# Patient Record
Sex: Female | Born: 1955 | Race: White | Hispanic: No | Marital: Married | Smoking: Never smoker
Health system: Southern US, Community
[De-identification: ages and names within clinical notes are randomized; demographics above are authoritative.]

## PROBLEM LIST (undated history)

## (undated) DIAGNOSIS — D649 Anemia, unspecified: Secondary | ICD-10-CM

## (undated) DIAGNOSIS — R7303 Prediabetes: Secondary | ICD-10-CM

## (undated) DIAGNOSIS — F329 Major depressive disorder, single episode, unspecified: Secondary | ICD-10-CM

## (undated) DIAGNOSIS — E785 Hyperlipidemia, unspecified: Secondary | ICD-10-CM

## (undated) DIAGNOSIS — F419 Anxiety disorder, unspecified: Secondary | ICD-10-CM

## (undated) DIAGNOSIS — R911 Solitary pulmonary nodule: Secondary | ICD-10-CM

## (undated) DIAGNOSIS — I10 Essential (primary) hypertension: Secondary | ICD-10-CM

## (undated) DIAGNOSIS — M419 Scoliosis, unspecified: Secondary | ICD-10-CM

## (undated) DIAGNOSIS — E119 Type 2 diabetes mellitus without complications: Secondary | ICD-10-CM

## (undated) DIAGNOSIS — I712 Thoracic aortic aneurysm, without rupture, unspecified: Secondary | ICD-10-CM

## (undated) DIAGNOSIS — F32A Depression, unspecified: Secondary | ICD-10-CM

## (undated) DIAGNOSIS — M1990 Unspecified osteoarthritis, unspecified site: Secondary | ICD-10-CM

## (undated) DIAGNOSIS — R519 Headache, unspecified: Secondary | ICD-10-CM

## (undated) HISTORY — PX: CHOLECYSTECTOMY, LAPAROSCOPIC: SHX56

## (undated) HISTORY — DX: Essential (primary) hypertension: I10

## (undated) HISTORY — DX: Hyperlipidemia, unspecified: E78.5

## (undated) HISTORY — DX: Type 2 diabetes mellitus without complications: E11.9

## (undated) HISTORY — PX: ANKLE SURGERY: SHX546

## (undated) HISTORY — PX: HEMORRHOID SURGERY: SHX153

## (undated) HISTORY — PX: BREAST BIOPSY: SHX20

## (undated) HISTORY — PX: ESOPHAGOGASTRODUODENOSCOPY: SHX1529

## (undated) HISTORY — DX: Major depressive disorder, single episode, unspecified: F32.9

## (undated) HISTORY — DX: Solitary pulmonary nodule: R91.1

## (undated) HISTORY — DX: Thoracic aortic aneurysm, without rupture, unspecified: I71.20

## (undated) HISTORY — DX: Depression, unspecified: F32.A

## (undated) HISTORY — DX: Scoliosis, unspecified: M41.9

---

## 2006-02-27 ENCOUNTER — Other Ambulatory Visit: Admission: RE | Admit: 2006-02-27 | Discharge: 2006-02-27 | Payer: Self-pay | Admitting: Obstetrics & Gynecology

## 2006-03-19 ENCOUNTER — Encounter: Admission: RE | Admit: 2006-03-19 | Discharge: 2006-03-19 | Payer: Self-pay | Admitting: Obstetrics & Gynecology

## 2007-01-07 ENCOUNTER — Ambulatory Visit (HOSPITAL_COMMUNITY): Admission: RE | Admit: 2007-01-07 | Discharge: 2007-01-07 | Payer: Self-pay | Admitting: Gastroenterology

## 2007-04-10 ENCOUNTER — Ambulatory Visit: Payer: Self-pay | Admitting: Internal Medicine

## 2007-06-09 DIAGNOSIS — E669 Obesity, unspecified: Secondary | ICD-10-CM | POA: Insufficient documentation

## 2007-06-09 DIAGNOSIS — I10 Essential (primary) hypertension: Secondary | ICD-10-CM | POA: Insufficient documentation

## 2007-06-09 DIAGNOSIS — R059 Cough, unspecified: Secondary | ICD-10-CM | POA: Insufficient documentation

## 2007-06-09 DIAGNOSIS — R05 Cough: Secondary | ICD-10-CM

## 2007-06-10 ENCOUNTER — Ambulatory Visit: Payer: Self-pay | Admitting: Internal Medicine

## 2007-06-26 ENCOUNTER — Telehealth: Payer: Self-pay | Admitting: Internal Medicine

## 2007-11-16 ENCOUNTER — Other Ambulatory Visit: Admission: RE | Admit: 2007-11-16 | Discharge: 2007-11-16 | Payer: Self-pay | Admitting: Obstetrics & Gynecology

## 2009-03-10 IMAGING — CR DG CHEST 2V
2 series · 2 of 2 positions shown · non-contrast
Comparison: none

CLINICAL DATA: Cough.  
 CHEST ? 2 VIEW:

[view not recorded (1 of 2)]
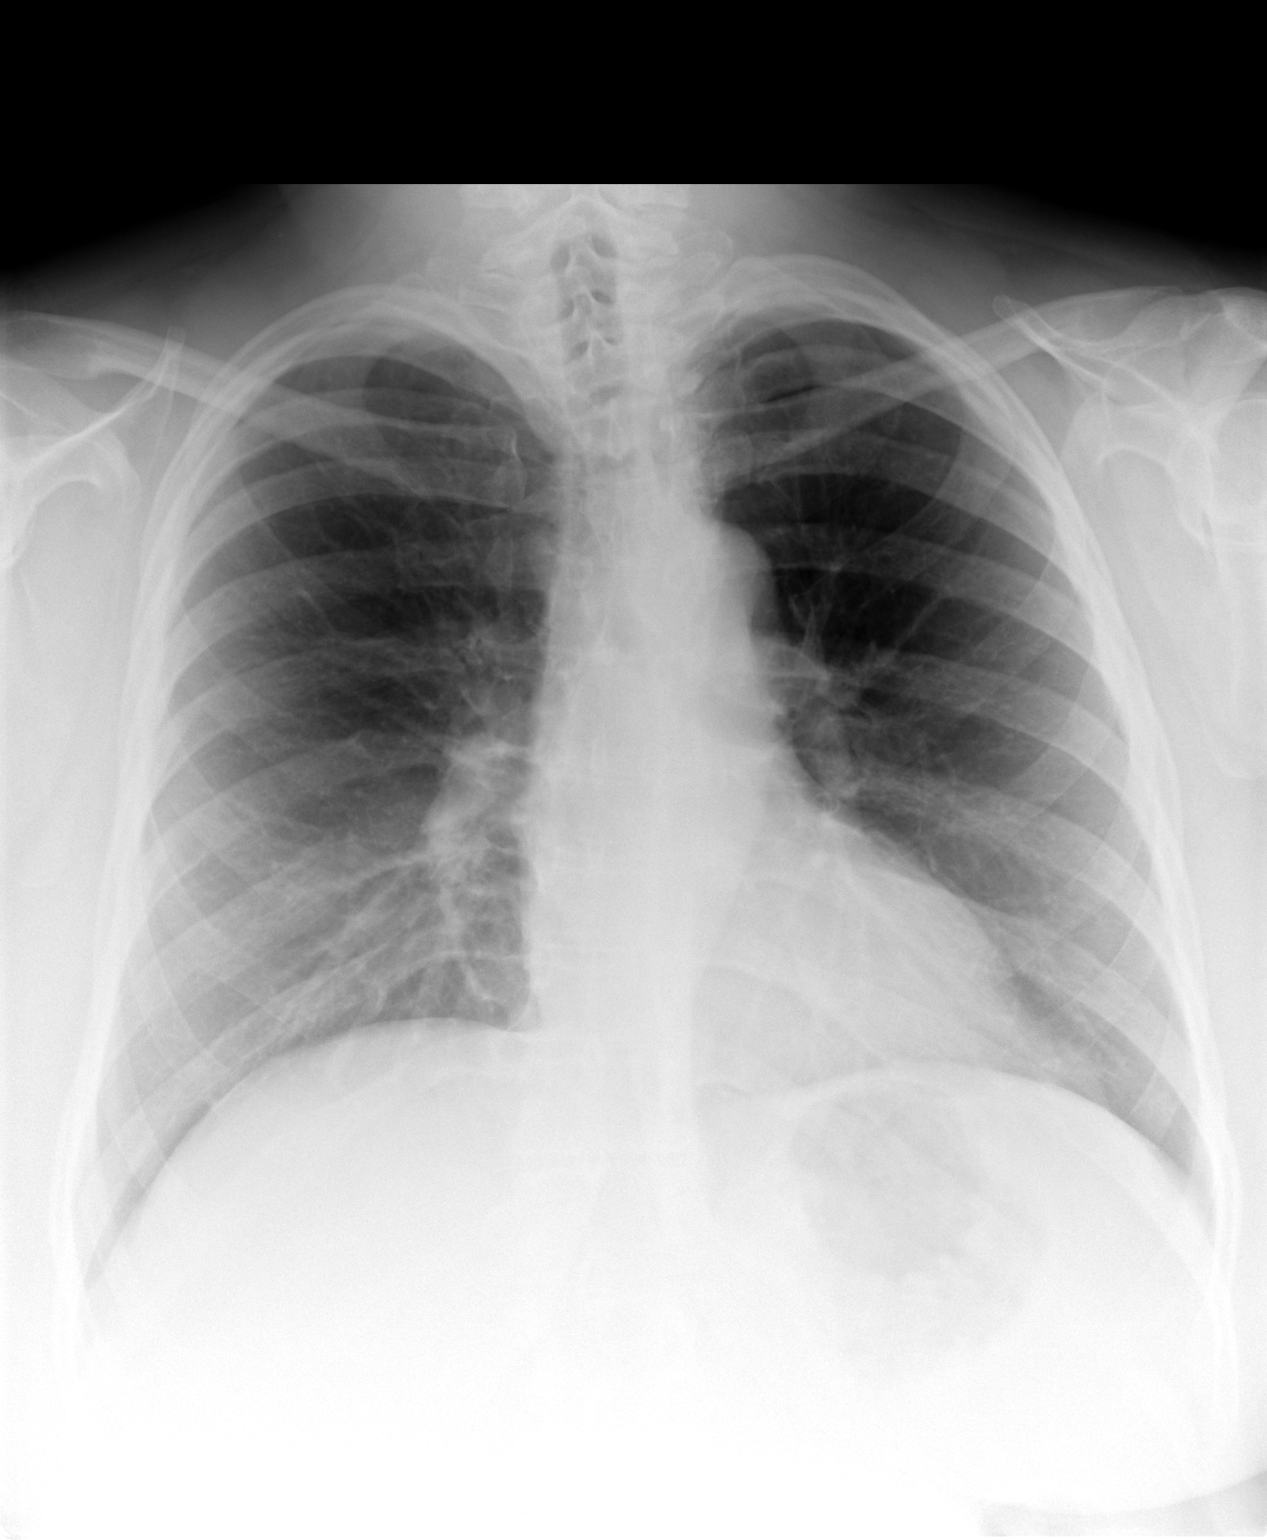

[view not recorded (2 of 2)]
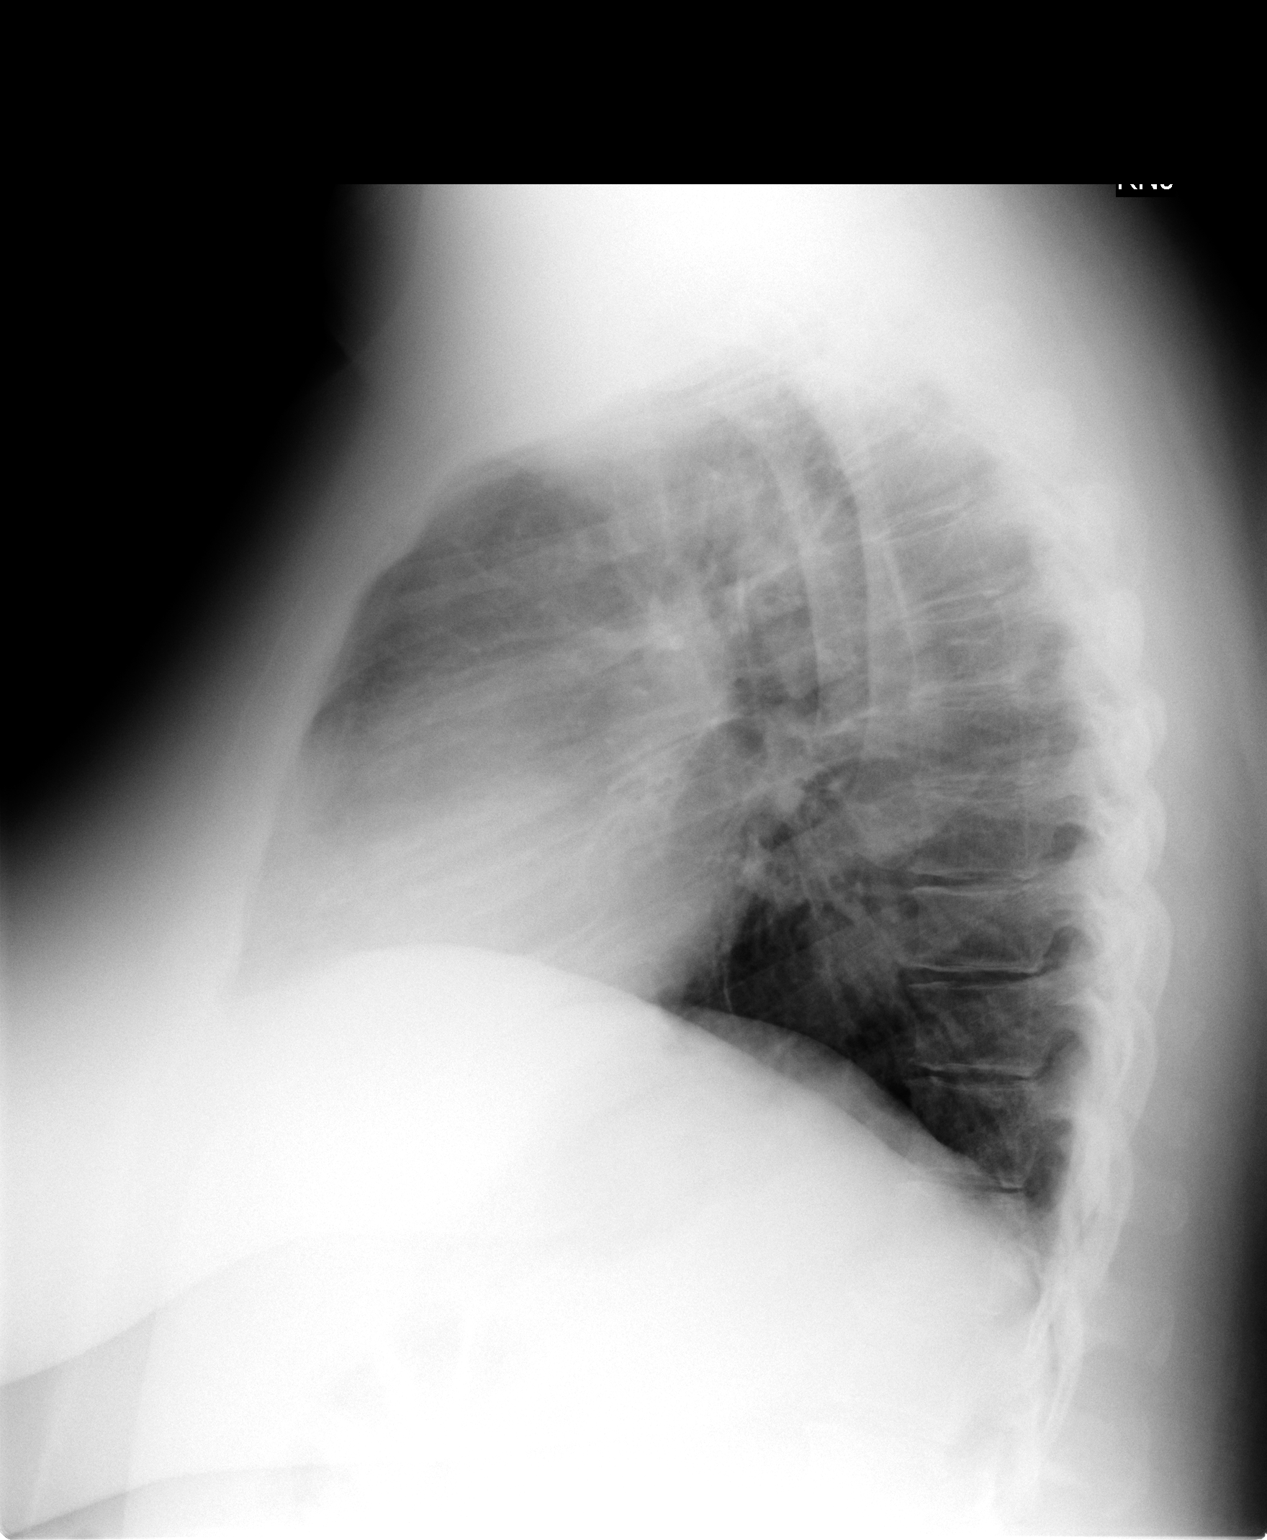

[2 of 2 positions shown; findings below may reference images not displayed]

FINDINGS: Accentuated peribronchial markings in the central lung zones compatible with chronic bronchitic changes.  No active infiltrate, consolidation, or atelectasis.  Cardiomediastinal silhouette unremarkable.
IMPRESSION: Chronic bronchitic changes.

## 2009-06-08 ENCOUNTER — Telehealth: Payer: Self-pay | Admitting: Gastroenterology

## 2009-07-18 ENCOUNTER — Ambulatory Visit: Payer: Self-pay | Admitting: Gastroenterology

## 2010-05-27 ENCOUNTER — Encounter: Payer: Self-pay | Admitting: Obstetrics & Gynecology

## 2010-06-05 NOTE — Progress Notes (Signed)
Summary: Triage  Phone Note Call from Patient Call back at [phone]   Caller: Patient Call For: Dr. Christella Hartigan Reason for Call: Talk to Nurse Summary of Call: Pt scheduled an appt. for 07-18-09 and wants to know if she can be seen sooner b/c she is "choking" Initial call taken by: Karna Christmas,  June 08, 2009 12:51 PM  Follow-up for Phone Call        Returned patient call and no answer left message on machine to call back Chales Abrahams CMA Duncan Dull)  June 08, 2009 12:59 PM   left message on machine to call back Chales Abrahams CMA [facility]ull)  June 09, 2009 10:41 AM   I called the home number as well left message on machine to call back Chales Abrahams CMA [facility] Dull)  June 09, 2009 10:42 AM

## 2010-06-05 NOTE — Assessment & Plan Note (Signed)
Summary: F/U PER PT/MH   Visit Type:  extended fu PCP:  Evelena Peat  Chief Complaint:  Pt here for rov to followup her cough.  She states that it is the same and no better or worse.  She has no new complaints..  History of Present Illness:   55 year old white female never smoker now coughing for over a year and was seen on 04/10/2007 while on ACE inhibitors with a classic upper airway cyclical cough for which I thought ACE inhibitors probably were partially finding the fire and should be stopped for at least 6 weeks.  She was treated briefly with PPI and prednisone but states nothing helps by trauma fall, which makes her too dopey to take.  The cough is especially bad immediately outlined down at night.  Since her last visit she was evaluated by an allergist he said this problem was an allergy.  In addition, she was clinically diagnosed as a  sinus infection treated with an antibiotic for a follow-up CT scan showed no evidence of any any significant sinusitis and her symptoms have persisted.  The cough is not really productive.Pt denies any significant sore throat, nasal congestion or excess secretions, fever, chills, sweats, unintended wt loss, pleuritic or exertional cp, orthopnea pnd or leg swelling.   Pt also denies any obvious fluctuation in symptoms with weather or environmental change or other alleviating or aggravating factors.     Current Meds:  SIMVASTATIN 40 MG  TABS (SIMVASTATIN) once daily AZOR 10-40 MG  TABS (AMLODIPINE-OLMESARTAN) once daily TRAMADOL HCL 50 MG  TABS (TRAMADOL HCL) as needed  Multivitamin day at breakfast    Current Allergies: No known allergies   Past Medical History:    Current Problems:     COUGH (ICD-786.2)    OBESITY (ICD-278.00)    HYPERTENSION, SEVERE (ICD-401.0)    hyperlipidemia       Past Surgical History:    status post cholecystectomy 2006   Family History:    positive for asthma in her mother        Possible heart disease in her  father  Social History:    she has never smoked.  She works as a Manufacturing systems engineer     Vital Signs:  Patient Profile:   55 Years Old Female Weight:      249.13 pounds O2 Sat:      100 % O2 treatment:    Room Air Temp:     98.0 degrees F oral Pulse rate:   66 / minute BP sitting:   126 / 78  (left arm)  Vitals Entered By: Vernie Murders (June 10, 2007 9:47 AM)                 Physical Exam  this patient appeared both anxious and depressed but sometimes a hopeless helpless affect attitude and frequent throat clearing during interview exam but no excess sputum produced.   Ambulatory healthy appearing in no acute distress. Afeb with normal vital signs HEENT: nl dentition, turbinates, and orophanx. Nl external ear canals without cough reflex Neck without JVD/Nodes/TM Lungs clear to A and P bilaterally without cough on insp or exp maneuvers RRR no s3 or murmur or increase in P2 Abd soft and benign with nl excursion in the supine position. No bruits or organomegaly Ext warm without calf tenderness, cyanosis clubbing or edema Skin warm and dry without lesions       Impression & Recommendations:  Problem # 1:  COUGH (ICD-786.2) chest x-ray  reviewed from 56 09/2006 is normal I had an extended discussion with the patient today lasting 15 to 20 minutes of a 25 minute visit on the following issues:   she has a classical upper airway cough pattern it started while she was on ACE inhibitors and probably has secondary reflux but fails to understand that coughing causes reflux which  causes secondary coughing, even though GERD is not the primary problem.  She became very frustrated when I discussed this with her MI even showing a central reflux plays and cyclical cough from a diagram I may draw on her previouchest x-ray reviewed from 6 5 2008 is normals visit.  I did go over the 14 step algorithm designed by Dr. [place] at [facility] and emphasizing that we  have not  completed step one yet, and asked to do so before we consider step two.  Since she's had a recent sinus CT scan, the next step in the workup, after GERD isoh treated aggressively and cyclical cough is limited, would be to perform a methacholine challenge test while being treated maximally for  reflux.   Each maintenance medication was reviewed in detail including most importantly the difference between maintenance and as needed and under what circumstances the prns are to be used, resulting in the following new medications recommendations: SIMVASTATIN 40 MG  TABS (SIMVASTATIN) once daily AZOR 10-40 MG  TABS (AMLODIPINE-OLMESARTAN) once daily ZEGERID 40-1100 MG  CAPS (OMEPRAZOLE-SODIUM BICARBONATE) One at bedtime BROVEX 12 MG/5ML  SUSP (BROMPHENIRAMINE TANNATE) 1 tsp twice daily DELSYM 30 MG/5ML  LQCR (DEXTROMETHORPHAN POLISTIREX) 2 tsp every 12 hours PREDNISONE 10 MG  TABS (PREDNISONE) 4 each am x 2days, 2x2days, 1x2days and stop TRAMADOL HCL 50 MG  TABS (TRAMADOL HCL) as needed    Medications Added to Medication List This Visit: 1)  Azor 10-40 Mg Tabs (Amlodipine-olmesartan) .... Once daily 2)  Zegerid 40-1100 Mg Caps (Omeprazole-sodium bicarbonate) .... One at bedtime 3)  Brovex 12 Mg/53ml Susp (Brompheniramine tannate) .Marland Kitchen.. 1 tsp twice daily 4)  Delsym 30 Mg/33ml Lqcr (Dextromethorphan polistirex) .... 2 tsp every 12 hours 5)  Prednisone 10 Mg Tabs (Prednisone) .... 4 each am x 2days, 2x2days, 1x2days and stop 6)  Tramadol Hcl 50 Mg Tabs (Tramadol hcl) .... As needed   Patient Instructions: 1)  GERD (gastroesophageal reflux disease) was discussed. It is a common cause of respiratory symptoms. It commonly presents in the absence of heartburn. GERD can be treated with medication, but also with lifestyle changes including avoidance of late meals, excessive alcohol, smoking cessation, and avoidance of foods and drinks including those that the person notes to cause symptoms, fatty  foods, chocolate, peppermint, colas, red wine, and acidic juices such as orange juice. no use of mint or menthol lozenges, use sugar free candy    Prescriptions: TRAMADOL HCL 50 MG  TABS (TRAMADOL HCL) as needed  #40 x 11   Entered and Authorized by:   Nyoka Cowden MD   Signed by:   Nyoka Cowden MD on 06/10/2007   Method used:   Electronically sent to ...       [facility]  [phone]*       [address]       [place], [place]  [address]       Ph: [phone] or [phone]       Fax: [phone]   RxID:   [id number] BROVEX 12 MG/5ML  SUSP (BROMPHENIRAMINE TANNATE) 1 tsp twice daily  #240cc  x 0   Entered and Authorized by:   Nyoka Cowden MD   Signed by:   Nyoka Cowden MD on 06/10/2007   Method used:   Electronically sent to ...       [facility]  [phone]*       [address]       [place], [place]  [address]       Ph: [phone] or [phone]       Fax: [phone]   RxID:   [id number] ZEGERID 40-1100 MG  CAPS (OMEPRAZOLE-SODIUM BICARBONATE) One at bedtime  #34 x 11   Entered and Authorized by:   Nyoka Cowden MD   Signed by:   Nyoka Cowden MD on 06/10/2007   Method used:   Electronically sent to ...       [facility]  [phone]*       [address]       [place], [place]  [address]       Ph: [phone] or [phone]       Fax: [phone]   RxID:   [id number] PREDNISONE 10 MG  TABS (PREDNISONE) 4 each am x 2days, 2x2days, 1x2days and stop  #14 x 0   Entered and Authorized by:   Nyoka Cowden MD   Signed by:   Nyoka Cowden MD on 06/10/2007   Method used:   Electronically sent to ...       [facility]  [phone]*       [address]       [place], [place]  [address]       Ph: [phone] or [phone]       Fax: [phone]   RxID:   [id number]  ]  Appended Document: Orders Update    Clinical Lists Changes  Orders: Added new Service order of Est. Patient  Level IV (28366) - Signed

## 2010-09-18 NOTE — Assessment & Plan Note (Signed)
[facility]                             PULMONARY OFFICE NOTE   Sonia, [name]                     MRN:          [id number]  DATE:04/10/2007                            DOB:          17-Mar-1956    REFERRING PHYSICIAN:  Evelena Peat, M.D.   This is a pulmonary consultation requested by Dr. Caryl Never.   REASON FOR CONSULTATION:  Cough.   HISTORY:  A 55 year old, white female, never smoker, with no significant  chronic respiratory complaints acutely ill with a bad sinus infection  in January 2008 and ever since then unable to stop coughing for any  duration.  She describes her cough as more daytime than nighttime but at  night she is noticing nasal obstructive symptoms despite using Nasonex  and has already undergone an ENT evaluation (she does not remember the  doctor's name) which was felt to be negative.  The cough is mostly dry  at this point and is associated with sensation of excess throat mucus  but actually when she coughs nothing comes up.  She denies any overt  sinus or reflux symptoms or previous history of asthma or significant  atopy or seasonable sinus complaints.  She has just undergone an  endoscopy and is felt to have no evidence of reflux, within the last  several weeks does not recall the doctor's name.   PAST MEDICAL HISTORY:  1. Hypertension for which she has been on and off ACE inhibitors over      the duration of the cough.  She says stopping them made no      difference but did not remember how long they had been      discontinued.  2. Obesity.  3. Status post cholecystectomy in 2006.   ALLERGIES:  None known.   MEDICATIONS:  Include both Diovan and Lisinopril, Simvastatin, estriol,  progestin, Nasonex, and Benadryl.  For a full inventory with dosing  please see medication face sheet, dated April 10, 2007.   SOCIAL HISTORY:  She has never smoked.  She denies any significant  excess alcohol use.  She works as  a Manufacturing systems engineer but denies  excessive tendency to URI or sinus problems.   FAMILY HISTORY:  Positive for asthma in her mother, heart disease in her  father.   REVIEW OF SYSTEMS:  Taken in detail on the worksheet and negative except  as outlined above.   PHYSICAL EXAMINATION:  GENERAL:  This is a pleasant, ambulatory, obese,  white female in no acute distress.  VITAL SIGNS:  Weight 250 pounds, with a blood pressure of 158/99, pulse  rate of 70.  HEENT:  Significant for frequent throat clearing.  Oropharynx is clear.  Nasal turbinates reveal moderate nonspecific turbinate edema with no  definite purulent secretions or sinus pallor or polyps.  Dentition is  intact.  Ear canals are clear bilaterally with no cough reflex.  NECK:  Supple without cervical adenopathy or tenderness.  Trachea is  midline.  No thyromegaly.  LUNGS:  Fields perfectly clear bilaterally on auscultation percussion  with cough and it occurred early in inspiration.  She pointed at the  base of her suprasternal notch as the location of the tickle.  HEART:  Regular rhythm without murmur, gallop, or rub.  ABDOMEN:  Soft, benign.  EXTREMITIES:  Warm without calf tenderness, cyanosis, clubbing, edema.   Chest x-ray is pending.   IMPRESSION:  1. This patient has a classic cyclical cough that may have started      with sinusitis with post nasal drip syndrome but by the time she      was seen by ENT, the horse was out of the barn because she has      now developed a cyclical cough that is markedly disproportionate to      a cough that would be caused by sinus infection and note the      absence of purulent secretions that accumulate overnight while she      sleeps in terms of her history.  I explained the cyclical cough      syndrome to her in both graphic and text formatted material to ask      for her help in suppressing cyclical cough by combining Delsym and      Tramadol.  At the same time she needs to be  maintained on Prevacid,      not because I think that reflux is causing the cough but most      likely cough is causing enough reflux to induce further coughing.      I think that ACE inhibitors are not the cause of her problem but      certainly flame the fire of any airways inflammation that is      generated from either coughing or reflux and should be stopped.  2. Morbid obesity complicated by severe hypertension.  I recommended a      trial of Azor 5/40 one daily over the next 4 weeks.  I also      emphasized to the patient that there is a 14-step process      emphasized in the guidelines that require strict compliance to a      protocol rather than additional studies at this point, so that she      can give [place] feedback as to whether the above regimen is effective      or not and avoid further studies if possible.  However, the next      study I would recommend repeating is a sinus CT scan perhaps      consideration of adding Reglan to Prevacid just to cover the      possibility of non-acid reflux contributing to cough given her body      habitus and its associated risk for this problem.     Charlaine Dalton. Sherene Sires, MD, [facility]  Electronically Signed    MBW/MedQ  DD: 04/10/2007  DT: 04/10/2007  Job #: [id number]   cc:   Evelena Peat, M.D.

## 2010-09-18 NOTE — Op Note (Signed)
NAMENANCY, ARVIN NO.:  [id number]   MEDICAL RECORD NO.:  [id number]          PATIENT TYPE:  AMB   LOCATION:  ENDO                         FACILITY[facility]   PHYSICIAN:  Shirley Friar, MDDATE OF BIRTH:  02-06-1956   DATE OF PROCEDURE:  01/07/2007  DATE OF DISCHARGE:                               OPERATIVE REPORT   PROCEDURE PERFORMED:  Upper endoscopy and Bravo capsule placement.   INDICATION:  Chronic cough.   MEDICATIONS:  Fentanyl 100 mcg IV, Versed 7 mg IV, Phenergan 25 mg IV,  10 cc of viscous lidocaine.   FINDINGS:  The endoscope was inserted into the oropharynx.  The  esophagus was intubated which was normal in its entirety.  The GE  junction was noted be at 38 cm from the incisors.  Endoscope was  advanced into the stomach which revealed normal stomach lining.  No  mucosal abnormalities were seen.  Retroflexion revealed a small hiatal  hernia.  Endoscope was straightened and advanced to the duodenal bulb  and second portion of the duodenum which were both normal.  Endoscope  was then withdrawn.  The Bravo capsule catheter was inserted in the  usual fashion and advanced to 32 cm which is approximately 6 cm below  the GE junction.  The Bravo capsule was successfully deployed and the  endoscope was reinserted to confirm adequate placement.  No immediate  complications were observed.   ASSESSMENT:  1. Small hiatal hernia.  2. Successful Bravo capsule placement at 32 cm.   PLAN:  Follow-up on Bravo capsule reading and repeat Bravo capsule will  be done off of PPI.      Shirley Friar, MD  Electronically Signed     VCS/MEDQ  D:  01/07/2007  T:  01/07/2007  Job:  [id number]   cc:   [facility]  Fax: [phone]

## 2010-09-21 NOTE — Op Note (Signed)
[name], [name]              ACCOUNT NO.:  [id number]   MEDICAL RECORD NO.:  [id number]          PATIENT TYPE:  AMB   LOCATION:  ENDO                         [facility]AC[facility]  [facility]   PHYSICIAN:  Shirley Friar, MDDATE OF BIRTH:  1956-03-26   DATE OF PROCEDURE:  DATE OF DISCHARGE:  01/07/2007                               OPERATIVE REPORT   PROCEDURE PERFORMED:  Bravo capsule.   INDICATION:  Chronic cough.   The procedure was done off of proton pump inhibitors.   FINDINGS:  DeMeester score on day one of 10.2 with normal being less  than 14.72.  DeMeester score on day two of 14.5 with normal being less  than 14.72.   SUMMARY:  No significant acidic reflux over the 48-hour pH study.  I  think her chronic coughing is not related to her occasional ascitic  reflux.  We recommend further evaluation with upper respiratory versus  allergic source of her symptoms.      Shirley Friar, MD  Electronically Signed     VCS/MEDQ  D:  01/13/2007  T:  01/13/2007  Job:  [id number]

## 2012-08-19 ENCOUNTER — Telehealth: Payer: Self-pay | Admitting: Obstetrics & Gynecology

## 2012-08-19 MED ORDER — ESTRADIOL 2 MG VA RING: 2.0000 mg | VAGINAL_RING | VAGINAL | Status: DC

## 2012-08-19 NOTE — Telephone Encounter (Signed)
Routed to [email or web address]  Dr. Hyacinth Meeker, please advise

## 2012-08-19 NOTE — Telephone Encounter (Signed)
"  e-ring" pt requesting an rx/used previously for vaginal dryness and reports dryness is back/[facility]

## 2012-08-19 NOTE — Telephone Encounter (Signed)
Can I have this chart too, please.

## 2012-08-19 NOTE — Telephone Encounter (Signed)
Fine to refill 

## 2012-08-20 NOTE — Telephone Encounter (Signed)
Pt is aware of refill request being filled.  

## 2012-09-30 DIAGNOSIS — M1710 Unilateral primary osteoarthritis, unspecified knee: Secondary | ICD-10-CM | POA: Insufficient documentation

## 2012-11-16 DIAGNOSIS — E1169 Type 2 diabetes mellitus with other specified complication: Secondary | ICD-10-CM | POA: Insufficient documentation

## 2012-11-16 DIAGNOSIS — E785 Hyperlipidemia, unspecified: Secondary | ICD-10-CM | POA: Insufficient documentation

## 2012-11-16 DIAGNOSIS — E119 Type 2 diabetes mellitus without complications: Secondary | ICD-10-CM | POA: Insufficient documentation

## 2012-11-16 DIAGNOSIS — Z419 Encounter for procedure for purposes other than remedying health state, unspecified: Secondary | ICD-10-CM | POA: Insufficient documentation

## 2012-12-04 HISTORY — PX: TOTAL KNEE ARTHROPLASTY: SHX125

## 2013-02-12 ENCOUNTER — Encounter: Payer: Self-pay | Admitting: Cardiovascular Disease

## 2013-05-01 ENCOUNTER — Other Ambulatory Visit: Payer: Self-pay | Admitting: Cardiovascular Disease

## 2013-05-03 NOTE — Telephone Encounter (Signed)
Rx was sent to pharmacy electronically. (last OV 9.5.2013)

## 2013-09-16 ENCOUNTER — Encounter: Payer: Self-pay | Admitting: Obstetrics & Gynecology

## 2013-09-17 ENCOUNTER — Telehealth: Payer: Self-pay | Admitting: Obstetrics & Gynecology

## 2013-09-17 ENCOUNTER — Ambulatory Visit (INDEPENDENT_AMBULATORY_CARE_PROVIDER_SITE_OTHER): Payer: Managed Care, Other (non HMO) | Admitting: Obstetrics & Gynecology

## 2013-09-17 ENCOUNTER — Encounter: Payer: Self-pay | Admitting: Obstetrics & Gynecology

## 2013-09-17 VITALS — BP 156/72 | HR 64 | Resp 20 | Ht 64.25 in | Wt 225.2 lb

## 2013-09-17 DIAGNOSIS — Z01419 Encounter for gynecological examination (general) (routine) without abnormal findings: Secondary | ICD-10-CM

## 2013-09-17 DIAGNOSIS — Z1211 Encounter for screening for malignant neoplasm of colon: Secondary | ICD-10-CM

## 2013-09-17 NOTE — Progress Notes (Signed)
58 y.o. G4P3 [name] here for annual exam.  No vaginal bleeding.  Pt has not had MMG in the past year.  She does not want to do them yearly.  D/W pt 3D MMG.    [facility].  Just walked a 5K.  Doing yoga regularly.     Patient's last menstrual period was 05/06/2002.          Sexually active: yes  The current method of family planning is post menopausal status.    Exercising: yes  yoga and cardio Smoker:  no  Health Maintenance: Pap:  07/06/12 WNL/negative HR HPV History of abnormal Pap:  no MMG:  7/13 Colonoscopy:  2005-? repeat BMD:   none TDaP:  PCP Screening Labs: PCP, Hb today: PCP, Urine today: PCP   reports that she has never smoked. She has never used smokeless tobacco. She reports that she drinks about 2 - 2.5 ounces of alcohol per week. She reports that she does not use illicit drugs.  Past Medical History  Diagnosis Date  . Hyperlipemia   . Hypertension   . Depression   . Diabetes     being watched    Past Surgical History  Procedure Laterality Date  . Cesarean section      x3  . Hemorrhoid surgery    . Breast biopsy    . Cholecystectomy, laparoscopic    . Esophagogastroduodenoscopy    . 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 amlodipine-olmesartan (AZOR) 10-20 MG per tablet Take 1 tablet by mouth daily.      Marland Kitchen. aspirin 81 MG tablet Take 81 mg by mouth daily.      Marland Kitchen. escitalopram (LEXAPRO) 20 MG tablet Take 20 mg by mouth daily.      . Flaxseed, Linseed, (FLAX SEED OIL PO) Take by mouth daily.      Marland Kitchen. ibuprofen (ADVIL,MOTRIN) 200 MG tablet Take 200 mg by mouth every 6 (six) hours as needed.      . metFORMIN (GLUCOPHAGE) 500 MG tablet Take by mouth daily with breakfast.      . Multiple Vitamins-Minerals (MULTIVITAMIN PO) Take by mouth.      Marland Kitchen. NASONEX 50 MCG/ACT nasal spray as needed.      . Omega-3 Fatty Acids (FISH OIL PO) Take by mouth.      .  simvastatin (ZOCOR) 20 MG tablet Take 1 tablet (20 mg total) by mouth daily. <please schedule appointment>  30 tablet  0  . ALPRAZolam (XANAX) 0.25 MG tablet as needed.      Marland Kitchen. HYDROcodone-acetaminophen (NORCO/VICODIN) 5-325 MG per tablet as needed.       No current facility-administered medications for this visit.    Family History  Problem Relation Age of Onset  . Diabetes Father   . Hypertension Mother   . Hypertension Father   . Heart disease Father     ROS:  Pertinent items are noted in HPI.  Otherwise, a comprehensive ROS was negative.  Exam:   BP 156/72  Pulse 64  Resp 20  Ht 5' 4.25" (1.632 m)  Wt 225 lb 3.2 oz (102.15 kg)  BMI 38.35 kg/m2  LMP 05/06/2002    Height: 5' 4.25" (163.2 cm)  Ht Readings from Last 3 Encounters:  09/17/13 5' 4.25" (1.632 m)    General appearance: alert, cooperative and appears stated age Head: Normocephalic, without obvious abnormality,  atraumatic Neck: no adenopathy, supple, symmetrical, trachea midline and thyroid normal to inspection and palpation Lungs: clear to auscultation bilaterally Breasts: normal appearance, no masses or tenderness Heart: regular rate and rhythm Abdomen: soft, non-tender; bowel sounds normal; no masses,  no organomegaly Extremities: extremities normal, atraumatic, no cyanosis or edema Skin: Skin color, texture, turgor normal. No rashes or lesions Lymph nodes: Cervical, supraclavicular, and axillary nodes normal. No abnormal inguinal nodes palpated Neurologic: Grossly normal   Pelvic: External genitalia:  no lesions              Urethra:  normal appearing urethra with no masses, tenderness or lesions              Bartholins and Skenes: normal                 Vagina: normal appearing vagina with normal color and discharge, no lesions              Cervix: no lesions              Pap taken: no Bimanual Exam:  Uterus:  normal size, contour, position, consistency, mobility, non-tender              Adnexa: normal  adnexa and no mass, fullness, tenderness               Rectovaginal: Confirms               Anus:  normal sphincter tone, no lesions  A:  Well Woman with normal exam PMP, no HRT Obesity Diabetes Hypertension  P:   Mammogram yearly recommended.  Pt states she will go this year. pap smear with neg HR HPV 3/14.  No pap today Labs every three months. return annually or prn  An After Visit Summary was printed and given to the patient.

## 2013-09-17 NOTE — Telephone Encounter (Signed)
Pt says she forgot to mention to Dr Hyacinth MeekerMiller about sending refill of her premarin creme to [facility] at [phone].

## 2013-09-17 NOTE — Patient Instructions (Signed)

## 2013-09-20 MED ORDER — ESTRADIOL 0.1 MG/GM VA CREA: TOPICAL_CREAM | VAGINAL | Status: DC

## 2013-09-20 NOTE — Telephone Encounter (Signed)
Spoke with patient. Patient states that she was previously prescribed Prempro by Dr.Miller which was around two years ago. States she meant to ask Dr.Miller to refill when she was here for aex last week but forgot. Advised would pull patients chart for review and send a message to Dr.Miller regarding refill request. Patient agreeable.  Dr.Miller, patient's paper chart to your desk for review. Patient had Estrace vaginal cream apply vaginally 2 times per week refilled last on 11/2008. Prior to that patient was using prempro but reports "A lot of side effects." Order pending for Estrace vaginal cream to pharmacy of choice per your approval.

## 2013-09-20 NOTE — Telephone Encounter (Signed)
Spoke with patient. Advised Dr.Miller sent over prescription for Estrace vaginal cream over to pharmacy of choice. Patient agreeable and verbalizes understanding.  Patient agreeable to disposition. Will close encounter.

## 2014-03-07 ENCOUNTER — Encounter: Payer: Self-pay | Admitting: Obstetrics & Gynecology

## 2014-03-23 ENCOUNTER — Encounter: Payer: Self-pay | Attending: Family Medicine | Admitting: *Deleted

## 2014-04-15 ENCOUNTER — Other Ambulatory Visit: Payer: Self-pay | Admitting: Family Medicine

## 2014-04-15 DIAGNOSIS — M5412 Radiculopathy, cervical region: Secondary | ICD-10-CM

## 2014-04-19 ENCOUNTER — Other Ambulatory Visit: Payer: 59

## 2014-05-11 ENCOUNTER — Other Ambulatory Visit: Payer: 59

## 2014-09-22 ENCOUNTER — Telehealth: Payer: Self-pay | Admitting: Obstetrics & Gynecology

## 2014-09-22 NOTE — Telephone Encounter (Signed)
Left patient a message to call back to reschedule a future appointment that was cancelled by the provider. °

## 2014-09-26 ENCOUNTER — Ambulatory Visit: Payer: Managed Care, Other (non HMO) | Admitting: Obstetrics & Gynecology

## 2014-10-08 ENCOUNTER — Other Ambulatory Visit: Payer: Self-pay

## 2014-10-11 ENCOUNTER — Telehealth: Payer: Self-pay | Admitting: Obstetrics & Gynecology

## 2014-10-11 ENCOUNTER — Ambulatory Visit: Payer: Managed Care, Other (non HMO) | Admitting: Obstetrics & Gynecology

## 2014-10-11 NOTE — Telephone Encounter (Signed)
Pt called to cancel appointment scheduled 10/11/14 at 1400 with Dr. Hyacinth MeekerMiller. Pt states she will call back to reschedule appointment.

## 2015-06-06 ENCOUNTER — Ambulatory Visit (INDEPENDENT_AMBULATORY_CARE_PROVIDER_SITE_OTHER): Payer: No Typology Code available for payment source | Admitting: Obstetrics & Gynecology

## 2015-06-06 ENCOUNTER — Encounter: Payer: Self-pay | Admitting: Obstetrics & Gynecology

## 2015-06-06 ENCOUNTER — Telehealth: Payer: Self-pay | Admitting: Obstetrics & Gynecology

## 2015-06-06 VITALS — BP 112/80 | HR 84 | Resp 20 | Ht 63.5 in | Wt 230.0 lb

## 2015-06-06 DIAGNOSIS — R6882 Decreased libido: Secondary | ICD-10-CM | POA: Diagnosis not present

## 2015-06-06 DIAGNOSIS — Z124 Encounter for screening for malignant neoplasm of cervix: Secondary | ICD-10-CM

## 2015-06-06 DIAGNOSIS — Z1211 Encounter for screening for malignant neoplasm of colon: Secondary | ICD-10-CM

## 2015-06-06 DIAGNOSIS — Z01419 Encounter for gynecological examination (general) (routine) without abnormal findings: Secondary | ICD-10-CM | POA: Diagnosis not present

## 2015-06-06 NOTE — Progress Notes (Signed)
60 y.o. G4P3 [name] here for annual exam.  Doing well.  Pt denies vaginal bleeding.  Pt's biggest complaint is decreased libido.  Pt reports husband has stopped smoking.  He is on a little celexa and this has really affected his erectile dysfunction.  Pt would like to be a little more of the initiator.   Pt actively working on weight loss.  She is on weight watchers and down #27.       Patient's last menstrual period was 05/06/2002.          Sexually active: Yes.    The current method of family planning is post menopausal status.    Exercising: Yes.    Cardio Smoker:  no  Health Maintenance: Pap:  07/06/2012 Neg. Sonia [name] [name]:neg History of abnormal Pap:  no MMG: 12/15/13 BIRADS2:benign  Colonoscopy:  2005.  Declines today. BMD:   None.   TDaP:  [name] Screening Labs: [name], Hb today: [name], Urine today: [name]   reports that she has never smoked. She has never used smokeless tobacco. She reports that she drinks about 2.0 - 2.5 oz of alcohol per week. She reports that she does not use illicit drugs.  Past Medical History  Diagnosis Date  . Hyperlipemia   . Hypertension   . Depression   . Diabetes Sonia [name] [name])     being watched    Past Surgical History  Procedure Laterality Date  . Cesarean section      x3  . Hemorrhoid surgery    . Breast biopsy    . Cholecystectomy, laparoscopic    . Esophagogastroduodenoscopy    . Total knee arthroplasty Left 8/14         Current Outpatient Prescriptions  Medication Sig Dispense Refill  . ALPRAZolam (XANAX) 0.25 MG tablet as needed.    Marland Kitchen amLODipine (NORVASC) 10 MG tablet     . aspirin 81 MG tablet Take 81 mg by mouth daily.    Marland Kitchen BENICAR 40 MG tablet     . Flaxseed, Linseed, (FLAX SEED OIL PO) Take by mouth daily.    Marland Kitchen ibuprofen (ADVIL,MOTRIN) 200 MG tablet Take 200 mg by mouth every 6 (six) hours as needed.    . meloxicam (MOBIC) 15 MG tablet Take 15 mg by mouth daily.  11  . metFORMIN (GLUCOPHAGE-XR) 500 MG 24 Sonia tablet TAKE ONE TABLET BY MOUTH  EVERY DAY WITH EVENING MEAL.  11  . Multiple Vitamins-Minerals (MULTIVITAMIN PO) Take by mouth.    Marland Kitchen NASONEX 50 MCG/ACT nasal spray as needed.    . Omega-3 Fatty Acids (FISH OIL PO) Take by mouth.    . simvastatin (ZOCOR) 20 MG tablet Take 1 tablet (20 mg total) by mouth daily. <please schedule appointment> 30 tablet 0   No current facility-administered medications for this visit.    Family History  Problem Relation Age of Onset  . Diabetes Father   . Hypertension Mother   . Hypertension Father   . Heart disease Father     ROS:  Pertinent items are noted in HPI.  Otherwise, a comprehensive ROS was negative.  Exam:   BP 112/80 mmHg  Pulse 84  Resp 20  Ht 5' 3.5" (1.613 m)  Wt 230 lb (104.327 kg)  BMI 40.10 kg/m2  LMP 05/06/2002  Weight change: +5#   Height: 5' 3.5" (161.3 cm)  Ht Readings from Last 3 Encounters:  06/06/15 5' 3.5" (1.613 m)  09/17/13 5' 4.25" (1.632 m)    General appearance: alert, cooperative and appears  stated age Head: Normocephalic, without obvious abnormality, atraumatic Neck: no adenopathy, supple, symmetrical, trachea midline and thyroid normal to inspection and palpation Lungs: clear to auscultation bilaterally Breasts: normal appearance, no masses or tenderness Heart: regular rate and rhythm Abdomen: soft, non-tender; bowel sounds normal; no masses,  no organomegaly Extremities: extremities normal, atraumatic, no cyanosis or edema Skin: Skin color, texture, turgor normal. No rashes or lesions Lymph nodes: Cervical, supraclavicular, and axillary nodes normal. No abnormal inguinal nodes palpated Neurologic: Grossly normal   Pelvic: External genitalia:  no lesions              Urethra:  normal appearing urethra with no masses, tenderness or lesions              Bartholins and Skenes: normal                 Vagina: normal appearing vagina with normal color and discharge, no lesions              Cervix: no lesions              Pap taken: Yes.    Bimanual Exam:  Uterus:  normal size, contour, position, consistency, mobility, non-tender              Adnexa: normal adnexa and no mass, fullness, tenderness               Rectovaginal: Confirms               Anus:  normal sphincter tone, no lesions  Chaperone was present for exam.  A:  Well Woman with normal exam PMP, no HRT Obesity Diabetes Hypertension GERD, significantly improved with her weight loss this past year. Decreased libido  P: Mammogram yearly recommended. Last one 2015.  Pt states she will schedule.   Pap smear with neg Sonia [name] [name] 3/14. Pap with Sonia Sonia [name] [name]. Colonoscopy due.  Pt declines.  Aware I feel this is very important. Labs every three months. Testosterone level today.  Will consider topical testosterone depending on level.  return annually or prn

## 2015-06-06 NOTE — Telephone Encounter (Signed)
Patient was seen today and told a testosterone prescription would be called to a different pharmacy. Patient cannot remember the pharmacy Dr.Miller told her this be called to.

## 2015-06-06 NOTE — Telephone Encounter (Signed)
Spoke with patient. Advised of OV note as seen below from Dr.Miller. The patient is agreeable and verbalizes understanding. Advised the pharmacy we usually send Testosterone over to is [facility] pharmacy if this prescription is needed. She is agreeable. Advised she will be notified of results as soon as they have returned.  P: Mammogram yearly recommended. Last one 2015. Pt states she will schedule.  Pap smear with neg HR HPV 3/14. Pap with HR HR HPV. Colonoscopy due. Pt declines. Aware I feel this is very important. Labs every three months. Testosterone level today. Will consider topical testosterone depending on level.  return annually or prn  Routing to provider for final review. Patient agreeable to disposition. Will close encounter.

## 2015-06-07 LAB — TESTOSTERONE: Testosterone: 27 ng/dL

## 2015-06-08 LAB — IPS PAP TEST WITH HPV

## 2015-06-13 ENCOUNTER — Other Ambulatory Visit: Payer: Self-pay | Admitting: *Deleted

## 2015-06-13 MED ORDER — NONFORMULARY OR COMPOUNDED ITEM: Status: DC

## 2015-08-31 ENCOUNTER — Telehealth: Payer: Self-pay | Admitting: Obstetrics & Gynecology

## 2015-08-31 NOTE — Telephone Encounter (Signed)
Patient canceled her appointment for testosterone recheck. She did not want to rescheduled.

## 2015-08-31 NOTE — Telephone Encounter (Signed)
Left message to call Kaitlyn at [phone]. 

## 2015-09-04 NOTE — Telephone Encounter (Signed)
Spoke with patient. Patient states that she stopped using the Testosterone ointment and declines to reschedule her lab appointment. Advised if she restarts testosterone ointment at any time she will need to contact the office to schedule a follow up lab appointment. She is agreeable.

## 2015-09-05 ENCOUNTER — Institutional Professional Consult (permissible substitution): Payer: No Typology Code available for payment source | Admitting: Obstetrics & Gynecology

## 2016-01-25 ENCOUNTER — Ambulatory Visit: Payer: Managed Care, Other (non HMO) | Admitting: Obstetrics & Gynecology

## 2016-01-30 ENCOUNTER — Encounter: Payer: Self-pay | Admitting: Obstetrics & Gynecology

## 2016-04-05 DIAGNOSIS — M4802 Spinal stenosis, cervical region: Secondary | ICD-10-CM | POA: Insufficient documentation

## 2016-04-17 DIAGNOSIS — F419 Anxiety disorder, unspecified: Secondary | ICD-10-CM | POA: Insufficient documentation

## 2016-06-27 DIAGNOSIS — E559 Vitamin D deficiency, unspecified: Secondary | ICD-10-CM | POA: Insufficient documentation

## 2016-09-17 ENCOUNTER — Ambulatory Visit: Payer: No Typology Code available for payment source | Admitting: Obstetrics & Gynecology

## 2016-10-17 DIAGNOSIS — Z79899 Other long term (current) drug therapy: Secondary | ICD-10-CM | POA: Insufficient documentation

## 2017-01-24 DIAGNOSIS — R42 Dizziness and giddiness: Secondary | ICD-10-CM | POA: Insufficient documentation

## 2017-06-16 DIAGNOSIS — K219 Gastro-esophageal reflux disease without esophagitis: Secondary | ICD-10-CM | POA: Insufficient documentation

## 2017-08-20 DIAGNOSIS — G43909 Migraine, unspecified, not intractable, without status migrainosus: Secondary | ICD-10-CM | POA: Insufficient documentation

## 2017-11-10 ENCOUNTER — Ambulatory Visit: Payer: No Typology Code available for payment source | Admitting: Obstetrics & Gynecology

## 2017-11-24 ENCOUNTER — Other Ambulatory Visit: Payer: Self-pay

## 2017-11-24 ENCOUNTER — Ambulatory Visit: Payer: Managed Care, Other (non HMO) | Admitting: Obstetrics & Gynecology

## 2017-11-24 ENCOUNTER — Other Ambulatory Visit (HOSPITAL_COMMUNITY)
Admission: RE | Admit: 2017-11-24 | Discharge: 2017-11-24 | Disposition: A | Discharge status: Home / Self Care | Payer: Managed Care, Other (non HMO) | Source: Ambulatory Visit | Attending: Obstetrics & Gynecology | Admitting: Obstetrics & Gynecology

## 2017-11-24 ENCOUNTER — Encounter: Payer: Self-pay | Admitting: Obstetrics & Gynecology

## 2017-11-24 VITALS — BP 138/74 | HR 76 | Resp 18 | Ht 63.5 in | Wt 228.8 lb

## 2017-11-24 DIAGNOSIS — Z124 Encounter for screening for malignant neoplasm of cervix: Secondary | ICD-10-CM | POA: Insufficient documentation

## 2017-11-24 DIAGNOSIS — Z01419 Encounter for gynecological examination (general) (routine) without abnormal findings: Secondary | ICD-10-CM

## 2017-11-24 NOTE — Progress Notes (Signed)
62 y.o. G4P3 [name] here for annual exam.  Moved to [place], [place], in October 2017.  Husband can work from home.  Desires to be in the [place] area.    Denies vaginal bleeding.  PCP:  Dr. Theodoro Gristave in [place].     Patient's last menstrual period was 05/06/2002.          Sexually active: Yes.    The current method of family planning is post menopausal status.    Exercising: Yes.    yoga, swim, biking, treadmill Smoker:  no  Health Maintenance: Pap:  06/06/15 Neg. HR HPV:neg  History of abnormal Pap:  no  MMG:  01/18/16 BIRADS2:Benign. Had one today   Colonoscopy:  2005 BMD:   Never TDaP:  PCP. Thinks this is due.  Declines today.   Pneumonia vaccine(s):  n/a Shingrix:   D/w pt today.  Declines doing this.   Hep C testing: done with PCP in [place] Screening Labs: PCP   reports that she has never smoked. She has never used smokeless tobacco. She reports that she drinks about 3.0 - 3.6 oz of alcohol per week. She reports that she does not use drugs.  Past Medical History:  Diagnosis Date  . Depression   . Diabetes (HCC)    being watched  . Hyperlipemia   . Hypertension   . Scoliosis     Past Surgical History:  Procedure Laterality Date  . BREAST BIOPSY    . CESAREAN SECTION     x3  . CHOLECYSTECTOMY, LAPAROSCOPIC    . ESOPHAGOGASTRODUODENOSCOPY    . HEMORRHOID SURGERY    . TOTAL KNEE ARTHROPLASTY Left 8/14        Current Outpatient Medications  Medication Sig Dispense Refill  . ALPRAZolam (XANAX) 0.25 MG tablet as needed.    Marland Kitchen. amLODipine (NORVASC) 10 MG tablet     . aspirin 81 MG tablet Take 81 mg by mouth daily.    Marland Kitchen. atorvastatin (LIPITOR) 40 MG tablet Take 1 tablet by mouth daily.    Marland Kitchen. BENICAR 40 MG tablet     . DULoxetine (CYMBALTA) 30 MG capsule Take 1 capsule by mouth daily.    . Flaxseed, Linseed, (FLAX SEED OIL PO) Take by mouth daily.    . hydrochlorothiazide (MICROZIDE) 12.5 MG capsule Take 1 capsule by mouth daily.    Marland Kitchen. ibuprofen (ADVIL,MOTRIN) 200 MG  tablet Take 200 mg by mouth every 6 (six) hours as needed.    . meloxicam (MOBIC) 15 MG tablet Take 15 mg by mouth daily.  11  . metFORMIN (GLUCOPHAGE-XR) 500 MG 24 hr tablet TAKE ONE TABLET BY MOUTH EVERY DAY WITH EVENING MEAL.  11  . Multiple Vitamins-Minerals (MULTIVITAMIN PO) Take by mouth.    . Omega-3 Fatty Acids (FISH OIL PO) Take by mouth.    . simvastatin (ZOCOR) 20 MG tablet Take 1 tablet (20 mg total) by mouth daily. <please schedule appointment> 30 tablet 0  . spironolactone (ALDACTONE) 25 MG tablet Take 1 tablet by mouth daily.    . Turmeric 500 MG CAPS Take by mouth daily.    . valACYclovir (VALTREX) 1000 MG tablet daily as needed.     No current facility-administered medications for this visit.     Family History  Problem Relation Age of Onset  . Diabetes Father   . Hypertension Father   . Heart disease Father   . Hypertension Mother     Review of Systems  All other systems reviewed and are negative.  Exam:   BP 138/74 (BP Location: Right Arm, Patient Position: Sitting, Cuff Size: Large)   Pulse 76   Resp 18   Ht 5' 3.5" (1.613 m)   Wt 228 lb 12.8 oz (103.8 kg)   LMP 05/06/2002   BMI 39.89 kg/m     Height: 5' 3.5" (161.3 cm)  Ht Readings from Last 3 Encounters:  11/24/17 5' 3.5" (1.613 m)  06/06/15 5' 3.5" (1.613 m)  09/17/13 5' 4.25" (1.632 m)    General appearance: alert, cooperative and appears stated age Head: Normocephalic, without obvious abnormality, atraumatic Neck: no adenopathy, supple, symmetrical, trachea midline and thyroid normal to inspection and palpation Lungs: clear to auscultation bilaterally Breasts: normal appearance, no masses or tenderness Heart: regular rate and rhythm Abdomen: soft, non-tender; bowel sounds normal; no masses,  no organomegaly Extremities: extremities normal, atraumatic, no cyanosis or edema Skin: Skin color, texture, turgor normal. No rashes or lesions Lymph nodes: Cervical, supraclavicular, and axillary nodes  normal. No abnormal inguinal nodes palpated Neurologic: Grossly normal   Pelvic: External genitalia:  no lesions              Urethra:  normal appearing urethra with no masses, tenderness or lesions              Bartholins and Skenes: normal                 Vagina: normal appearing vagina with normal color and discharge, no lesions              Cervix: no lesions              Pap taken: Yes.   Bimanual Exam:  Uterus:  normal size, contour, position, consistency, mobility, non-tender              Adnexa: normal adnexa and no mass, fullness, tenderness               Rectovaginal: Confirms               Anus:  normal sphincter tone, no lesions  Chaperone was present for exam.  A:  Well Woman with normal exam PMP, no HRT Type 2 Diabetes Hypertension GERD  P:   Mammogram updated today  pap smear obtained today Lab work done with Dr. Theodoro Grist Aware Tdap due and we discussed shingrix today Colonoscopy referral has been done Return annually or prn

## 2017-11-25 LAB — CYTOLOGY - PAP
Adequacy: ABSENT
Diagnosis: NEGATIVE

## 2017-12-09 ENCOUNTER — Encounter: Payer: Self-pay | Admitting: Obstetrics & Gynecology

## 2017-12-31 LAB — HM COLONOSCOPY

## 2021-09-19 NOTE — Progress Notes (Signed)
Sent message, via epic in basket, requesting order in epic from surgeon  ? ? ? 09/19/21 0856  ?Preop Orders  ?Has preop orders? No  ?Name of staff/physician contacted for orders(Indicate phone or IB message) B. Manson Passey, PA-C  ? ? ?

## 2021-09-24 NOTE — Progress Notes (Signed)
DUE TO COVID-19 ONLY  2  VISITOR IS ALLOWED TO COME WITH YOU AND STAY IN THE WAITING ROOM ONLY DURING PRE OP AND PROCEDURE DAY OF SURGERY.   4  VISITOR  MAY VISIT WITH YOU AFTER SURGERY IN YOUR PRIVATE ROOM DURING VISITING HOURS ONLY! YOU MAY HAVE ONE PERSON SPEND THE NITE WITH YOU IN YOUR ROOM AFTER SURGERY.     Your procedure is scheduled on:          10/09/2021   Report to [facility] Main  Entrance   Report to admitting at                AM DO NOT BRING INSURAN [id number] CARD, PICTURE ID OR WALLET DAY OF SURGERY.      Call this number if you have problems the morning of surgery [phone]    REMEMBER: NO  SOLID FOODS , CANDY, GUM OR MINTS AFTER MIDNITE THE NITE BEFORE SURGERY .       Marland Kitchen CLEAR LIQUIDS UNTIL    0430am            DAY OF SURGERY.      PLEASE FINISH  G2 Lower sugar  DRINK PER SURGEON ORDER  WHICH NEEDS TO BE COMPLETED AT    0430am       MORNING OF SURGERY.       CLEAR LIQUID DIET   Foods Allowed      WATER BLACK COFFEE ( SUGAR OK, NO MILK, CREAM OR CREAMER) REGULAR AND DECAF  TEA ( SUGAR OK NO MILK, CREAM, OR CREAMER) REGULAR AND DECAF  PLAIN JELLO ( NO RED)  FRUIT ICES ( NO RED, NO FRUIT PULP)  POPSICLES ( NO RED)  JUICE- APPLE, WHITE GRAPE AND WHITE CRANBERRY  SPORT DRINK LIKE GATORADE ( NO RED)  CLEAR BROTH ( VEGETABLE , CHICKEN OR BEEF)                                                                     BRUSH YOUR TEETH MORNING OF SURGERY AND RINSE YOUR MOUTH OUT, NO CHEWING GUM CANDY OR MINTS.     Take these medicines the morning of surgery with A SIP OF WATER:  amlodipine, cymbalta    DO NOT TAKE ANY DIABETIC MEDICATIONS DAY OF YOUR SURGERY                               You may not have any metal on your body including hair pins and              piercings  Do not wear jewelry, make-up, lotions, powders or perfumes, deodorant             Do not wear nail polish on your fingernails.              IF YOU ARE A FEMALE AND WANT TO SHAVE UNDER ARMS OR  LEGS PRIOR TO SURGERY YOU MUST DO SO AT LEAST 48 HOURS PRIOR TO SURGERY.              Men may shave face and neck.   Do not bring valuables to the hospital. [facility] IS NOT  RESPONSIBLE   FOR VALUABLES.  Contacts, dentures or bridgework may not be worn into surgery.  Leave suitcase in the car. After surgery it may be brought to your room.     Patients discharged the day of surgery will not be allowed to drive home. IF YOU ARE HAVING SURGERY AND GOING HOME THE SAME DAY, YOU MUST HAVE AN ADULT TO DRIVE YOU HOME AND BE WITH YOU FOR 24 HOURS. YOU MAY GO HOME BY TAXI OR UBER OR ORTHERWISE, BUT AN ADULT MUST ACCOMPANY YOU HOME AND STAY WITH YOU FOR 24 HOURS.                Please read over the following fact sheets you were given: _____________________________________________________________________  [facility] - Preparing for Surgery Before surgery, you can play an important role.  Because skin is not sterile, your skin needs to be as free of germs as possible.  You can reduce the number of germs on your skin by washing with CHG (chlorahexidine gluconate) soap before surgery.  CHG is an antiseptic cleaner which kills germs and bonds with the skin to continue killing germs even after washing. Please DO NOT use if you have an allergy to CHG or antibacterial soaps.  If your skin becomes reddened/irritated stop using the CHG and inform your nurse when you arrive at Short Stay. Do not shave (including legs and underarms) for at least 48 hours prior to the first CHG shower.  You may shave your face/neck. Please follow these instructions carefully:  1.  Shower with CHG Soap the night before surgery and the  morning of Surgery.  2.  If you choose to wash your hair, wash your hair first as usual with your  normal  shampoo.  3.  After you shampoo, rinse your hair and body thoroughly to remove the  shampoo.                           4.  Use CHG as you would any other liquid soap.  You can apply  chg directly  to the skin and wash                       Gently with a scrungie or clean washcloth.  5.  Apply the CHG Soap to your body ONLY FROM THE NECK DOWN.   Do not use on face/ open                           Wound or open sores. Avoid contact with eyes, ears mouth and genitals (private parts).                       Wash face,  Genitals (private parts) with your normal soap.             6.  Wash thoroughly, paying special attention to the area where your surgery  will be performed.  7.  Thoroughly rinse your body with warm water from the neck down.  8.  DO NOT shower/wash with your normal soap after using and rinsing off  the CHG Soap.                9.  Pat yourself dry with a clean towel.            10.  Wear clean pajamas.  11.  Place clean sheets on your bed the night of your first shower and do not  sleep with pets. Day of Surgery : Do not apply any lotions/deodorants the morning of surgery.  Please wear clean clothes to the hospital/surgery center.  FAILURE TO FOLLOW THESE INSTRUCTIONS MAY RESULT IN THE CANCELLATION OF YOUR SURGERY PATIENT SIGNATURE_________________________________  NURSE SIGNATURE__________________________________  ________________________________________________________________________

## 2021-09-24 NOTE — Progress Notes (Signed)
Anesthesia Review:  PCP: Katheren Puller LVO 09/03/21  Cardiologist : Chest x-ray : EKG : Echo : Stress test: Cardiac Cath :  Activity level:  Sleep Study/ CPAP : Fasting Blood Sugar :      / Checks Blood Sugar -- times a day:   Blood Thinner/ Instructions /Last Dose: ASA / Instructions/ Last Dose :   09/03/21-hgba1c- 6.7  DM- type  81 mg aspirin

## 2021-09-26 ENCOUNTER — Other Ambulatory Visit: Payer: Self-pay

## 2021-09-26 ENCOUNTER — Encounter (HOSPITAL_COMMUNITY)
Admission: RE | Admit: 2021-09-26 | Discharge: 2021-09-26 | Disposition: A | Discharge status: Home / Self Care | Payer: No Typology Code available for payment source | Source: Ambulatory Visit | Attending: Orthopedic Surgery | Admitting: Orthopedic Surgery

## 2021-09-26 ENCOUNTER — Encounter (HOSPITAL_COMMUNITY): Payer: Self-pay

## 2021-09-26 VITALS — BP 143/93 | HR 76 | Temp 98.5°F | Resp 16 | Ht 64.0 in | Wt 216.0 lb

## 2021-09-26 DIAGNOSIS — Z01818 Encounter for other preprocedural examination: Secondary | ICD-10-CM | POA: Diagnosis present

## 2021-09-26 HISTORY — DX: Prediabetes: R73.03

## 2021-09-26 HISTORY — DX: Unspecified osteoarthritis, unspecified site: M19.90

## 2021-09-26 HISTORY — DX: Anxiety disorder, unspecified: F41.9

## 2021-09-26 HISTORY — DX: Anemia, unspecified: D64.9

## 2021-09-26 HISTORY — DX: Headache, unspecified: R51.9

## 2021-09-26 LAB — CBC
HCT: 42 % (ref 36.0–46.0)
Hemoglobin: 14.4 g/dL (ref 12.0–15.0)
MCH: 30.6 pg (ref 26.0–34.0)
MCHC: 34.3 g/dL (ref 30.0–36.0)
MCV: 89.4 fL (ref 80.0–100.0)
Platelets: 277 10*3/uL (ref 150–400)
RBC: 4.7 MIL/uL (ref 3.87–5.11)
RDW: 11.8 % (ref 11.5–15.5)
WBC: 7.9 10*3/uL (ref 4.0–10.5)
nRBC: 0 % (ref 0.0–0.2)

## 2021-09-26 LAB — BASIC METABOLIC PANEL
Anion gap: 9 (ref 5–15)
BUN: 24 mg/dL — ABNORMAL HIGH (ref 8–23)
CO2: 27 mmol/L (ref 22–32)
Calcium: 9.9 mg/dL (ref 8.9–10.3)
Chloride: 101 mmol/L (ref 98–111)
Creatinine, Ser: 0.68 mg/dL (ref 0.44–1.00)
GFR, Estimated: >60 mL/min (ref >60)
Glucose, Bld: 148 mg/dL — ABNORMAL HIGH (ref 70–99)
Potassium: 4.4 mmol/L (ref 3.5–5.1)
Sodium: 137 mmol/L (ref 135–145)

## 2021-09-26 LAB — SURGICAL PCR SCREEN
MRSA, PCR: NEGATIVE
Staphylococcus aureus: POSITIVE — AB

## 2021-09-26 LAB — GLUCOSE, CAPILLARY: Glucose-Capillary: 149 mg/dL — ABNORMAL HIGH (ref 70–99)

## 2021-10-08 NOTE — H&P (Signed)
KNEE ARTHROPLASTY ADMISSION H&P  Patient ID: [name] MRN: [id number] DOB/AGE: 65/11/1955 65 y.o.  Chief Complaint: right knee pain.  Planned Procedure Date: 10/09/21 Medical Clearance by Katheren Puller,      HPI: [name] is a 66 y.o. female who presents for evaluation of djd right knee. The patient has a history of pain and functional disability in the right knee due to arthritis and has failed non-surgical conservative treatments for greater than 12 weeks to include NSAID's and/or analgesics and activity modification.  Onset of symptoms was gradual, starting 2 years ago with gradually worsening course since that time. The patient noted no past surgery on the right knee.  Patient currently rates pain at 8 out of 10 with activity. Patient has worsening of pain with activity and weight bearing and pain that interferes with activities of daily living.  Patient has evidence of joint space narrowing by imaging studies.  There is no active infection.  Past Medical History:  Diagnosis Date   Anemia    Anxiety    Arthritis    Headache    Hyperlipemia    Hypertension    Pre-diabetes    Scoliosis    Past Surgical History:  Procedure Laterality Date   BREAST BIOPSY     CESAREAN SECTION     x3   CHOLECYSTECTOMY, LAPAROSCOPIC     ESOPHAGOGASTRODUODENOSCOPY     HEMORRHOID SURGERY     TOTAL KNEE ARTHROPLASTY Left 8/14       Allergies  Allergen Reactions   Ace Inhibitors Cough        Crestor [Rosuvastatin] Other (See Comments)    Leg cramps   Latex     rash   Penicillins     Vaginal Infection    Prior to Admission medications   Medication Sig Start Date End Date Taking? Authorizing Provider  acetaminophen (TYLENOL) 650 MG CR tablet Take 1,300 mg by mouth every 8 (eight) hours as needed for pain.   Yes [provider]  ALPRAZolam (XANAX) 0.25 MG tablet Take 0.25 mg by mouth daily as needed for anxiety. 08/20/13  Yes [provider]  amLODipine  (NORVASC) 10 MG tablet Take 10 mg by mouth daily. 06/05/15  Yes [provider]  atorvastatin (LIPITOR) 80 MG tablet Take 80 mg by mouth daily.   Yes [provider]  clobetasol cream (TEMOVATE) 0.05 % Apply 1 application. topically every 3 (three) days.   Yes [provider]  DULoxetine (CYMBALTA) 30 MG capsule Take 30 mg by mouth daily. 10/28/17  Yes [provider]  ferrous sulfate 325 (65 FE) MG tablet Take 325 mg by mouth daily with breakfast.   Yes [provider]  losartan (COZAAR) 50 MG tablet Take 50 mg by mouth 2 (two) times daily.   Yes [provider]  magnesium gluconate (MAGONATE) 500 MG tablet Take 500 mg by mouth 2 (two) times daily.   Yes [provider]  meloxicam (MOBIC) 15 MG tablet Take 15 mg by mouth daily as needed for pain. 05/07/15  Yes [provider]  Misc Natural Products (GLUCOSAMINE CHOND MSM FORMULA PO) Take by mouth.   Yes [provider]  Multiple Vitamins-Minerals (MULTIVITAMIN PO) Take 1 tablet by mouth daily.   Yes [provider]  Probiotic Product (PROBIOTIC DAILY PO) Take 1 capsule by mouth daily.   Yes [provider]  spironolactone (ALDACTONE) 25 MG tablet Take 25 mg by mouth daily. 10/28/17  Yes [provider]  Turmeric 500 MG CAPS Take 500 mg by mouth daily.   Yes [provider]  valACYclovir (VALTREX) 1000 MG tablet Take 1,000 mg by mouth daily as needed (cold sores). 10/23/16  Yes [provider]   Social History   Socioeconomic History   Marital status: Married    Spouse name: Not on file   Number of children: Not on file   Years of education: Not on file   Highest education level: Not on file  Occupational History   Not on file  Tobacco Use   Smoking status: Never   Smokeless tobacco: Never  Vaping Use   Vaping Use: Never used  Substance and Sexual Activity   Alcohol use: Yes    Alcohol/week: 5.0 - 6.0 standard drinks     Types: 5 - 6 Standard drinks or equivalent per week    Comment: 2 glass 2 times per  week   Drug use: No   Sexual activity: Yes    Partners: Male    Birth control/protection: Post-menopausal  Other Topics Concern   Not on file  Social History Narrative   Not on file   Social Determinants of Health   Financial Resource Strain: Not on file  Food Insecurity: Not on file  Transportation Needs: Not on file  Physical Activity: Not on file  Stress: Not on file  Social Connections: Not on file   Family History  Problem Relation Age of Onset   Diabetes Father    Hypertension Father    Heart disease Father    Hypertension Mother     ROS: Currently denies lightheadedness, dizziness, Fever, chills, CP, SOB.   No personal history of DVT, PE, MI, or CVA. No loose teeth or dentures All other systems have been reviewed and were otherwise currently negative with the exception of those mentioned in the HPI and as above.  Objective: Vitals: Ht: 5\' 4"  Wt: 222 lbs Temp: 97.5 BP: 129/56 Pulse: 91 O2 97% on room air.   Physical Exam: General: Alert, NAD.  Antalgic Gait  HEENT: EOMI, Good Neck Extension  Pulm: No increased work of breathing.  Clear B/L A/P w/o crackle or wheeze.  CV: RRR, No m/g/r appreciated  GI: soft, NT, ND Neuro: Neuro without gross focal deficit.  Sensation intact distally Skin: No lesions in the area of chief complaint MSK/Surgical Site: right knee w/o redness or effusion. :  Positive medial joint line tenderness. She has 0-120 degrees of range of motion. Varus alignment. Positive crepitus. Positive pseudolaxity. Lachman feels intact.    Imaging Review Plain radiographs demonstrate severe degenerative joint disease of the right knee.   The overall alignment ismild varus. The bone quality appears to be adequate for age and reported activity level.  Preoperative templating of the joint replacement has been completed, documented, and submitted to the Operating Room  personnel in order to optimize intra-operative equipment management.  Assessment: Anteromedial djd right knee    Plan: Plan for Procedure(s): UNICOMPARTMENTAL KNEE  The patient history, physical exam, clinical judgement of the provider and imaging are consistent with end stage degenerative joint disease and total joint arthroplasty is deemed medically necessary. The treatment options including medical management, injection therapy, and arthroplasty were discussed at length. The risks and benefits of Procedure(s): UNICOMPARTMENTAL KNEE were presented and reviewed.  The risks of nonoperative treatment, versus surgical intervention including but not limited to continued pain, aseptic loosening, stiffness, dislocation/subluxation, infection, bleeding, nerve injury, blood clots, cardiopulmonary complications, morbidity, mortality, among others were discussed.  The patient verbalizes understanding and wishes to proceed with the plan.  Patient is being admitted for inpatient treatment for surgery, pain control, PT, prophylactic antibiotics, VTE prophylaxis, progressive ambulation, ADL's and discharge planning.   Dental prophylaxis discussed and recommended for 2 years postoperatively.  The patient does meet the criteria for TXA which will be used perioperatively.   ASA 325 mg will be used postoperatively for DVT prophylaxis in addition to SCDs, and early ambulation. The patient is planning to be discharged home with OPPT in care of husband   Armida SansBlaine K Tayshun Gappa, Cordelia Poche-C 10/08/2021 2:05 PM

## 2021-10-08 NOTE — Anesthesia Preprocedure Evaluation (Addendum)
Anesthesia Evaluation  Patient identified by MRN, date of birth, ID band Patient awake    Reviewed: Allergy & Precautions, NPO status , Patient's Chart, lab work & pertinent test results  History of Anesthesia Complications Negative for: history of anesthetic complications  Airway Mallampati: II  TM Distance: >3 FB Neck ROM: Full    Dental no notable dental hx. (+) Dental Advisory Given   Pulmonary neg pulmonary ROS,    Pulmonary exam normal        Cardiovascular hypertension, Pt. on medications Normal cardiovascular exam     Neuro/Psych  Headaches, PSYCHIATRIC DISORDERS Anxiety    GI/Hepatic Neg liver ROS, GERD  ,  Endo/Other  diabetes  Renal/GU negative Renal ROS     Musculoskeletal negative musculoskeletal ROS (+)   Abdominal   Peds  Hematology negative hematology ROS (+)   Anesthesia Other Findings   Reproductive/Obstetrics                            Anesthesia Physical Anesthesia Plan  ASA: 3  Anesthesia Plan: Spinal   Post-op Pain Management: Regional block*, Celebrex PO (pre-op)* and Tylenol PO (pre-op)*   Induction:   PONV Risk Score and Plan: 2 and Ondansetron, Propofol infusion and Midazolam  Airway Management Planned: Natural Airway and LMA  Additional Equipment:   Intra-op Plan:   Post-operative Plan: Extubation in OR  Informed Consent: I have reviewed the patients History and Physical, chart, labs and discussed the procedure including the risks, benefits and alternatives for the proposed anesthesia with the patient or authorized representative who has indicated his/her understanding and acceptance.     Dental advisory given  Plan Discussed with: Anesthesiologist, CRNA and Surgeon  Anesthesia Plan Comments:        Anesthesia Quick Evaluation

## 2021-10-09 ENCOUNTER — Ambulatory Visit (HOSPITAL_COMMUNITY)
Admission: RE | Admit: 2021-10-09 | Discharge: 2021-10-09 | Disposition: A | Discharge status: Home / Self Care | Payer: Medicare Other | Source: Ambulatory Visit | Attending: Orthopedic Surgery | Admitting: Orthopedic Surgery

## 2021-10-09 ENCOUNTER — Ambulatory Visit (HOSPITAL_COMMUNITY): Payer: Medicare Other

## 2021-10-09 ENCOUNTER — Other Ambulatory Visit: Payer: Self-pay

## 2021-10-09 ENCOUNTER — Ambulatory Visit (HOSPITAL_BASED_OUTPATIENT_CLINIC_OR_DEPARTMENT_OTHER): Payer: Medicare Other | Admitting: Certified Registered Nurse Anesthetist

## 2021-10-09 ENCOUNTER — Encounter (HOSPITAL_COMMUNITY)
Admission: RE | Disposition: A | Discharge status: Home / Self Care | Payer: Self-pay | Source: Ambulatory Visit | Attending: Orthopedic Surgery

## 2021-10-09 ENCOUNTER — Ambulatory Visit (HOSPITAL_COMMUNITY): Payer: Medicare Other | Admitting: Physician Assistant

## 2021-10-09 ENCOUNTER — Encounter (HOSPITAL_COMMUNITY): Payer: Self-pay | Admitting: Orthopedic Surgery

## 2021-10-09 DIAGNOSIS — Z01818 Encounter for other preprocedural examination: Secondary | ICD-10-CM

## 2021-10-09 DIAGNOSIS — E119 Type 2 diabetes mellitus without complications: Secondary | ICD-10-CM | POA: Diagnosis not present

## 2021-10-09 DIAGNOSIS — Z79899 Other long term (current) drug therapy: Secondary | ICD-10-CM | POA: Diagnosis not present

## 2021-10-09 DIAGNOSIS — M1711 Unilateral primary osteoarthritis, right knee: Secondary | ICD-10-CM

## 2021-10-09 DIAGNOSIS — F419 Anxiety disorder, unspecified: Secondary | ICD-10-CM

## 2021-10-09 DIAGNOSIS — I10 Essential (primary) hypertension: Secondary | ICD-10-CM | POA: Insufficient documentation

## 2021-10-09 DIAGNOSIS — K219 Gastro-esophageal reflux disease without esophagitis: Secondary | ICD-10-CM | POA: Insufficient documentation

## 2021-10-09 HISTORY — PX: PARTIAL KNEE ARTHROPLASTY: SHX2174

## 2021-10-09 LAB — GLUCOSE, CAPILLARY: Glucose-Capillary: 153 mg/dL — ABNORMAL HIGH (ref 70–99)

## 2021-10-09 SURGERY — ARTHROPLASTY, KNEE, UNICOMPARTMENTAL
Anesthesia: Spinal | Site: Knee | Laterality: Right

## 2021-10-09 MED ORDER — OXYCODONE HCL 5 MG PO TABS: 5.0000 mg | ORAL_TABLET | ORAL | Status: DC | PRN

## 2021-10-09 MED ORDER — BACLOFEN 10 MG PO TABS: 10.0000 mg | ORAL_TABLET | Freq: Three times a day (TID) | ORAL | 0 refills | Status: DC

## 2021-10-09 MED ORDER — POVIDONE-IODINE 10 % EX SWAB
2.0000 "application " | Freq: Once | CUTANEOUS | Status: AC
  Administered 2021-10-09: 2 via TOPICAL

## 2021-10-09 MED ORDER — GLYCOPYRROLATE 0.2 MG/ML IJ SOLN
INTRAMUSCULAR | Status: AC
  Filled 2021-10-09: qty 1

## 2021-10-09 MED ORDER — BUPIVACAINE HCL (PF) 0.25 % IJ SOLN
INTRAMUSCULAR | Status: AC
  Filled 2021-10-09: qty 30

## 2021-10-09 MED ORDER — ACETAMINOPHEN 500 MG PO TABS: 1000.0000 mg | ORAL_TABLET | Freq: Four times a day (QID) | ORAL | Status: DC

## 2021-10-09 MED ORDER — AMLODIPINE BESYLATE 10 MG PO TABS
10.0000 mg | ORAL_TABLET | Freq: Every day | ORAL | Status: DC
Start: 2021-10-09 — End: 2021-10-09

## 2021-10-09 MED ORDER — DULOXETINE HCL 30 MG PO CPEP: 30.0000 mg | ORAL_CAPSULE | Freq: Every day | ORAL | Status: DC

## 2021-10-09 MED ORDER — BUPIVACAINE IN DEXTROSE 0.75-8.25 % IT SOLN
INTRATHECAL | Status: DC | PRN
  Administered 2021-10-09: 1.8 mL via INTRATHECAL

## 2021-10-09 MED ORDER — KETOROLAC TROMETHAMINE 30 MG/ML IJ SOLN
INTRAMUSCULAR | Status: DC | PRN
  Administered 2021-10-09: 30 mg via INTRAVENOUS

## 2021-10-09 MED ORDER — DEXAMETHASONE SODIUM PHOSPHATE 10 MG/ML IJ SOLN: 10.0000 mg | Freq: Once | INTRAMUSCULAR | Status: DC

## 2021-10-09 MED ORDER — KETOROLAC TROMETHAMINE 30 MG/ML IJ SOLN
INTRAMUSCULAR | Status: AC
  Filled 2021-10-09: qty 1

## 2021-10-09 MED ORDER — ACETAMINOPHEN 500 MG PO TABS
1000.0000 mg | ORAL_TABLET | Freq: Once | ORAL | Status: AC
  Administered 2021-10-09: 1000 mg via ORAL
  Filled 2021-10-09: qty 2

## 2021-10-09 MED ORDER — CELECOXIB 200 MG PO CAPS
200.0000 mg | ORAL_CAPSULE | Freq: Once | ORAL | Status: AC
  Administered 2021-10-09: 200 mg via ORAL
  Filled 2021-10-09: qty 1

## 2021-10-09 MED ORDER — ONDANSETRON HCL 4 MG/2ML IJ SOLN: 4.0000 mg | Freq: Four times a day (QID) | INTRAMUSCULAR | Status: DC | PRN

## 2021-10-09 MED ORDER — POLYETHYLENE GLYCOL 3350 17 G PO PACK: 17.0000 g | PACK | Freq: Every day | ORAL | Status: DC | PRN

## 2021-10-09 MED ORDER — LOSARTAN POTASSIUM 50 MG PO TABS: 50.0000 mg | ORAL_TABLET | Freq: Two times a day (BID) | ORAL | Status: DC

## 2021-10-09 MED ORDER — ALPRAZOLAM 0.25 MG PO TABS: 0.2500 mg | ORAL_TABLET | Freq: Every day | ORAL | Status: DC | PRN

## 2021-10-09 MED ORDER — ASPIRIN 325 MG PO TBEC: 325.0000 mg | DELAYED_RELEASE_TABLET | Freq: Two times a day (BID) | ORAL | 0 refills | Status: DC

## 2021-10-09 MED ORDER — ASPIRIN 325 MG PO TBEC: 325.0000 mg | DELAYED_RELEASE_TABLET | Freq: Two times a day (BID) | ORAL | Status: DC

## 2021-10-09 MED ORDER — METHOCARBAMOL 500 MG IVPB - SIMPLE MED: 500.0000 mg | Freq: Four times a day (QID) | INTRAVENOUS | Status: DC | PRN

## 2021-10-09 MED ORDER — PROPOFOL 500 MG/50ML IV EMUL
INTRAVENOUS | Status: DC | PRN
  Administered 2021-10-09: 125 ug/kg/min via INTRAVENOUS

## 2021-10-09 MED ORDER — PHENOL 1.4 % MT LIQD: 1.0000 | OROMUCOSAL | Status: DC | PRN

## 2021-10-09 MED ORDER — HYDROMORPHONE HCL 1 MG/ML IJ SOLN: 0.5000 mg | INTRAMUSCULAR | Status: DC | PRN

## 2021-10-09 MED ORDER — MIDAZOLAM HCL 2 MG/2ML IJ SOLN
INTRAMUSCULAR | Status: AC
  Filled 2021-10-09: qty 2

## 2021-10-09 MED ORDER — FERROUS SULFATE 325 (65 FE) MG PO TABS: 325.0000 mg | ORAL_TABLET | Freq: Every day | ORAL | Status: DC

## 2021-10-09 MED ORDER — SPIRONOLACTONE 25 MG PO TABS: 25.0000 mg | ORAL_TABLET | Freq: Every day | ORAL | Status: DC

## 2021-10-09 MED ORDER — FENTANYL CITRATE PF 50 MCG/ML IJ SOSY: 25.0000 ug | PREFILLED_SYRINGE | INTRAMUSCULAR | Status: DC | PRN

## 2021-10-09 MED ORDER — GLYCOPYRROLATE 0.2 MG/ML IJ SOLN
INTRAMUSCULAR | Status: DC | PRN
  Administered 2021-10-09: .1 mg via INTRAVENOUS

## 2021-10-09 MED ORDER — PHENYLEPHRINE HCL-NACL 20-0.9 MG/250ML-% IV SOLN
INTRAVENOUS | Status: DC | PRN
  Administered 2021-10-09: 40 ug/min via INTRAVENOUS

## 2021-10-09 MED ORDER — DEXMEDETOMIDINE (PRECEDEX) IN NS 20 MCG/5ML (4 MCG/ML) IV SYRINGE
PREFILLED_SYRINGE | INTRAVENOUS | Status: AC
  Filled 2021-10-09: qty 5

## 2021-10-09 MED ORDER — METHOCARBAMOL 500 MG PO TABS: 500.0000 mg | ORAL_TABLET | Freq: Four times a day (QID) | ORAL | Status: DC | PRN

## 2021-10-09 MED ORDER — ONDANSETRON HCL 4 MG/2ML IJ SOLN
INTRAMUSCULAR | Status: AC
  Filled 2021-10-09: qty 2

## 2021-10-09 MED ORDER — PROPOFOL 10 MG/ML IV BOLUS
INTRAVENOUS | Status: DC | PRN
  Administered 2021-10-09: 20 mg via INTRAVENOUS
  Administered 2021-10-09: 40 mg via INTRAVENOUS
  Administered 2021-10-09: 20 mg via INTRAVENOUS
  Administered 2021-10-09: 30 mg via INTRAVENOUS
  Administered 2021-10-09: 10 mg via INTRAVENOUS

## 2021-10-09 MED ORDER — ACETAMINOPHEN 500 MG PO TABS: 1000.0000 mg | ORAL_TABLET | Freq: Once | ORAL | Status: DC

## 2021-10-09 MED ORDER — ORAL CARE MOUTH RINSE: 15.0000 mL | Freq: Once | OROMUCOSAL | Status: AC

## 2021-10-09 MED ORDER — PROPOFOL 1000 MG/100ML IV EMUL
INTRAVENOUS | Status: AC
  Filled 2021-10-09: qty 100

## 2021-10-09 MED ORDER — MENTHOL 3 MG MT LOZG: 1.0000 | LOZENGE | OROMUCOSAL | Status: DC | PRN

## 2021-10-09 MED ORDER — ATORVASTATIN CALCIUM 80 MG PO TABS
80.0000 mg | ORAL_TABLET | Freq: Every day | ORAL | Status: DC
Start: 2021-10-09 — End: 2021-10-09

## 2021-10-09 MED ORDER — PROMETHAZINE HCL 25 MG/ML IJ SOLN: 6.2500 mg | INTRAMUSCULAR | Status: DC | PRN

## 2021-10-09 MED ORDER — OXYCODONE HCL 5 MG PO TABS: 10.0000 mg | ORAL_TABLET | ORAL | Status: DC | PRN

## 2021-10-09 MED ORDER — SENNA-DOCUSATE SODIUM 8.6-50 MG PO TABS: 2.0000 | ORAL_TABLET | Freq: Every day | ORAL | 1 refills | Status: DC

## 2021-10-09 MED ORDER — PROPOFOL 10 MG/ML IV BOLUS
INTRAVENOUS | Status: AC
  Filled 2021-10-09: qty 20

## 2021-10-09 MED ORDER — LACTATED RINGERS IV BOLUS
500.0000 mL | Freq: Once | INTRAVENOUS | Status: AC
Start: 2021-10-09 — End: 2021-10-09
  Administered 2021-10-09: 500 mL via INTRAVENOUS

## 2021-10-09 MED ORDER — LACTATED RINGERS IV BOLUS: 250.0000 mL | Freq: Once | INTRAVENOUS | Status: DC

## 2021-10-09 MED ORDER — OXYCODONE-ACETAMINOPHEN 5-325 MG PO TABS: 1.0000 | ORAL_TABLET | ORAL | 0 refills | Status: AC | PRN

## 2021-10-09 MED ORDER — ONDANSETRON HCL 4 MG/2ML IJ SOLN
INTRAMUSCULAR | Status: DC | PRN
  Administered 2021-10-09: 4 mg via INTRAVENOUS

## 2021-10-09 MED ORDER — POTASSIUM CHLORIDE IN NACL 20-0.9 MEQ/L-% IV SOLN: INTRAVENOUS | Status: DC

## 2021-10-09 MED ORDER — ACETAMINOPHEN 325 MG PO TABS: 325.0000 mg | ORAL_TABLET | Freq: Four times a day (QID) | ORAL | Status: DC | PRN

## 2021-10-09 MED ORDER — DOCUSATE SODIUM 100 MG PO CAPS: 100.0000 mg | ORAL_CAPSULE | Freq: Two times a day (BID) | ORAL | Status: DC

## 2021-10-09 MED ORDER — BUPIVACAINE HCL 0.25 % IJ SOLN
INTRAMUSCULAR | Status: DC | PRN
  Administered 2021-10-09: 30 mL

## 2021-10-09 MED ORDER — FLEET ENEMA 7-19 GM/118ML RE ENEM: 1.0000 | ENEMA | Freq: Once | RECTAL | Status: DC | PRN

## 2021-10-09 MED ORDER — ONDANSETRON HCL 4 MG PO TABS: 4.0000 mg | ORAL_TABLET | Freq: Three times a day (TID) | ORAL | 0 refills | Status: DC | PRN

## 2021-10-09 MED ORDER — DEXAMETHASONE SODIUM PHOSPHATE 10 MG/ML IJ SOLN
INTRAMUSCULAR | Status: DC | PRN
  Administered 2021-10-09: 5 mg

## 2021-10-09 MED ORDER — DEXMEDETOMIDINE (PRECEDEX) IN NS 20 MCG/5ML (4 MCG/ML) IV SYRINGE
PREFILLED_SYRINGE | INTRAVENOUS | Status: DC | PRN
  Administered 2021-10-09: 8 ug via INTRAVENOUS
  Administered 2021-10-09: 4 ug via INTRAVENOUS
  Administered 2021-10-09: 8 ug via INTRAVENOUS

## 2021-10-09 MED ORDER — MAGNESIUM GLUCONATE 500 MG PO TABS: 500.0000 mg | ORAL_TABLET | Freq: Two times a day (BID) | ORAL | Status: DC

## 2021-10-09 MED ORDER — CHLORHEXIDINE GLUCONATE 0.12 % MT SOLN
15.0000 mL | Freq: Once | OROMUCOSAL | Status: AC
  Administered 2021-10-09: 15 mL via OROMUCOSAL

## 2021-10-09 MED ORDER — CEFAZOLIN SODIUM-DEXTROSE 2-4 GM/100ML-% IV SOLN
2.0000 g | INTRAVENOUS | Status: AC
  Administered 2021-10-09: 2 g via INTRAVENOUS
  Filled 2021-10-09: qty 100

## 2021-10-09 MED ORDER — BISACODYL 10 MG RE SUPP: 10.0000 mg | Freq: Every day | RECTAL | Status: DC | PRN

## 2021-10-09 MED ORDER — CEFAZOLIN SODIUM-DEXTROSE 2-4 GM/100ML-% IV SOLN: 2.0000 g | Freq: Four times a day (QID) | INTRAVENOUS | Status: DC

## 2021-10-09 MED ORDER — TRANEXAMIC ACID-NACL 1000-0.7 MG/100ML-% IV SOLN: 1000.0000 mg | Freq: Once | INTRAVENOUS | Status: DC

## 2021-10-09 MED ORDER — METOCLOPRAMIDE HCL 5 MG PO TABS: 5.0000 mg | ORAL_TABLET | Freq: Three times a day (TID) | ORAL | Status: DC | PRN

## 2021-10-09 MED ORDER — TRANEXAMIC ACID-NACL 1000-0.7 MG/100ML-% IV SOLN
1000.0000 mg | INTRAVENOUS | Status: AC
  Administered 2021-10-09: 1000 mg via INTRAVENOUS
  Filled 2021-10-09: qty 100

## 2021-10-09 MED ORDER — ONDANSETRON HCL 4 MG PO TABS: 4.0000 mg | ORAL_TABLET | Freq: Four times a day (QID) | ORAL | Status: DC | PRN

## 2021-10-09 MED ORDER — ALUM & MAG HYDROXIDE-SIMETH 200-200-20 MG/5ML PO SUSP: 30.0000 mL | ORAL | Status: DC | PRN

## 2021-10-09 MED ORDER — ROPIVACAINE HCL 7.5 MG/ML IJ SOLN
INTRAMUSCULAR | Status: DC | PRN
  Administered 2021-10-09: 20 mL via PERINEURAL

## 2021-10-09 MED ORDER — LACTATED RINGERS IV SOLN: INTRAVENOUS | Status: DC

## 2021-10-09 MED ORDER — MIDAZOLAM HCL 5 MG/5ML IJ SOLN
INTRAMUSCULAR | Status: DC | PRN
  Administered 2021-10-09 (×2): 2 mg via INTRAVENOUS

## 2021-10-09 MED ORDER — AMISULPRIDE (ANTIEMETIC) 5 MG/2ML IV SOLN: 10.0000 mg | Freq: Once | INTRAVENOUS | Status: DC | PRN

## 2021-10-09 MED ORDER — DIPHENHYDRAMINE HCL 12.5 MG/5ML PO ELIX: 12.5000 mg | ORAL_SOLUTION | ORAL | Status: DC | PRN

## 2021-10-09 MED ORDER — METOCLOPRAMIDE HCL 5 MG/ML IJ SOLN: 5.0000 mg | Freq: Three times a day (TID) | INTRAMUSCULAR | Status: DC | PRN

## 2021-10-09 SURGICAL SUPPLY — 73 items
BAG COUNTER SPONGE SURGICOUNT (BAG) IMPLANT
BAG SPEC THK2 15X12 ZIP CLS (MISCELLANEOUS) ×1
BAG SPNG CNTER NS LX DISP (BAG)
BAG ZIPLOCK 12X15 (MISCELLANEOUS) ×2 IMPLANT
BANDAGE ESMARK 6X9 LF (GAUZE/BANDAGES/DRESSINGS) ×1 IMPLANT
BEARING MENISCAL TIBIAL 6 SM R (Orthopedic Implant) ×1 IMPLANT
BIT DRILL QUICK REL 1/8 2PK SL (DRILL) IMPLANT
BLADE SURG 15 STRL LF DISP TIS (BLADE) ×1 IMPLANT
BLADE SURG 15 STRL SS (BLADE) ×2
BNDG CMPR 9X6 STRL LF SNTH (GAUZE/BANDAGES/DRESSINGS) ×1
BNDG CMPR MED 15X6 ELC VLCR LF (GAUZE/BANDAGES/DRESSINGS) ×1
BNDG ELASTIC 6X15 VLCR STRL LF (GAUZE/BANDAGES/DRESSINGS) ×2 IMPLANT
BNDG ESMARK 6X9 LF (GAUZE/BANDAGES/DRESSINGS) ×2
BOWL SMART MIX CTS (DISPOSABLE) ×2 IMPLANT
BRNG TIB SM 6 PHS 3 RT MEN (Orthopedic Implant) ×1 IMPLANT
CEMENT BONE R 1X40 (Cement) ×2 IMPLANT
CLSR STERI-STRIP ANTIMIC 1/2X4 (GAUZE/BANDAGES/DRESSINGS) ×2 IMPLANT
COMPONENT TIBIAL OXFRD MEDL RT (Orthopedic Implant) IMPLANT
COVER SURGICAL LIGHT HANDLE (MISCELLANEOUS) ×2 IMPLANT
CUFF TOURN SGL QUICK 34 (TOURNIQUET CUFF) ×2
CUFF TRNQT CYL 34X4.125X (TOURNIQUET CUFF) ×1 IMPLANT
DRAPE EXTREMITY T 121X128X90 (DISPOSABLE) ×2 IMPLANT
DRAPE POUCH INSTRU U-SHP 10X18 (DRAPES) ×2 IMPLANT
DRAPE SHEET LG 3/4 BI-LAMINATE (DRAPES) ×2 IMPLANT
DRAPE U-SHAPE 47X51 STRL (DRAPES) ×2 IMPLANT
DRILL QUICK RELEASE 1/8 INCH (DRILL) ×2
DRSG MEPILEX BORDER 4X8 (GAUZE/BANDAGES/DRESSINGS) ×2 IMPLANT
DRSG PAD ABDOMINAL 8X10 ST (GAUZE/BANDAGES/DRESSINGS) ×2 IMPLANT
DURAPREP 26ML APPLICATOR (WOUND CARE) ×4 IMPLANT
ELECT REM PT RETURN 15FT ADLT (MISCELLANEOUS) ×2 IMPLANT
FACESHIELD WRAPAROUND (MASK) ×2 IMPLANT
FACESHIELD WRAPAROUND OR TEAM (MASK) ×1 IMPLANT
GAUZE PAD ABD 8X10 STRL (GAUZE/BANDAGES/DRESSINGS) ×1 IMPLANT
GLOVE BIO SURGEON STRL SZ7 (GLOVE) ×2 IMPLANT
GLOVE BIOGEL PI IND STRL 7.0 (GLOVE) ×1 IMPLANT
GLOVE BIOGEL PI IND STRL 8 (GLOVE) ×1 IMPLANT
GLOVE BIOGEL PI INDICATOR 7.0 (GLOVE) ×1
GLOVE BIOGEL PI INDICATOR 8 (GLOVE) ×1
GLOVE SURG POLYISO LF SZ7.5 (GLOVE) ×2 IMPLANT
GOWN STRL REUS W/ TWL LRG LVL3 (GOWN DISPOSABLE) ×2 IMPLANT
GOWN STRL REUS W/TWL LRG LVL3 (GOWN DISPOSABLE) ×4
HANDPIECE INTERPULSE COAX TIP (DISPOSABLE) ×2
HOLDER FOLEY CATH W/STRAP (MISCELLANEOUS) IMPLANT
HOOD PEEL AWAY FLYTE STAYCOOL (MISCELLANEOUS) ×4 IMPLANT
IMMOBILIZER KNEE 20 (SOFTGOODS) ×2
IMMOBILIZER KNEE 20 THIGH 36 (SOFTGOODS) IMPLANT
IMMOBILIZER KNEE 22 UNIV (SOFTGOODS) IMPLANT
KIT BASIN OR (CUSTOM PROCEDURE TRAY) ×2 IMPLANT
KIT TURNOVER KIT A (KITS) IMPLANT
NDL SAFETY ECLIPSE 18X1.5 (NEEDLE) ×1 IMPLANT
NEEDLE HYPO 18GX1.5 SHARP (NEEDLE) ×2
NS IRRIG 1000ML POUR BTL (IV SOLUTION) ×2 IMPLANT
PACK BLADE SAW RECIP 70 3 PT (BLADE) ×1 IMPLANT
PACK TOTAL JOINT (CUSTOM PROCEDURE TRAY) ×2 IMPLANT
PEG FEMORAL PEGGED STRL SM (Knees) ×1 IMPLANT
PROTECTOR NERVE ULNAR (MISCELLANEOUS) ×2 IMPLANT
SET HNDPC FAN SPRY TIP SCT (DISPOSABLE) ×1 IMPLANT
SPIKE FLUID TRANSFER (MISCELLANEOUS) IMPLANT
SUCTION FRAZIER HANDLE 12FR (TUBING) ×2
SUCTION TUBE FRAZIER 12FR DISP (TUBING) ×1 IMPLANT
SUT VIC AB 1 CT1 36 (SUTURE) ×2 IMPLANT
SUT VIC AB 2-0 CT1 27 (SUTURE) ×2
SUT VIC AB 2-0 CT1 TAPERPNT 27 (SUTURE) ×1 IMPLANT
SUT VIC AB 3-0 SH 8-18 (SUTURE) ×2 IMPLANT
SYR 30ML LL (SYRINGE) ×2 IMPLANT
SYR 3ML LL SCALE MARK (SYRINGE) ×2 IMPLANT
TIBIAL OXFORD MEDIAL RT (Orthopedic Implant) ×2 IMPLANT
TOWEL OR 17X26 10 PK STRL BLUE (TOWEL DISPOSABLE) ×2 IMPLANT
TOWEL OR NON WOVEN STRL DISP B (DISPOSABLE) ×2 IMPLANT
TRAY FOLEY MTR SLVR 16FR STAT (SET/KITS/TRAYS/PACK) ×2 IMPLANT
TUBE SUCTION HIGH CAP CLEAR NV (SUCTIONS) ×2 IMPLANT
WATER STERILE IRR 1000ML POUR (IV SOLUTION) ×2 IMPLANT
WRAP KNEE MAXI GEL POST OP (GAUZE/BANDAGES/DRESSINGS) ×2 IMPLANT

## 2021-10-09 NOTE — Evaluation (Signed)
Physical Therapy Evaluation Patient Details Name: [name] MRN: [id number] DOB: June 17, 1955 Today's Date: 10/09/2021  History of Present Illness  Pt is a 66yo female presenting s/p R-unicompartmental knee on 10/08/21. PMH: OA, HLD, HTN, L-TKA 2014.  Clinical Impression  [name] is a 66 y.o. female POD 0 s/p R-unicompartmental knee replacement. Patient reports modified independence using SPC for mobility at baseline. Patient is now limited by functional impairments (see PT problem list below) and requires min guard for transfers and gait with RW. Patient was able to ambulate 80 feet with RW and min guard and cues for safe walker management. Patient educated on safe sequencing for stair mobility and verbalized safe guarding position for people assisting with mobility. Patient instructed in exercises to facilitate ROM and circulation. Patient will benefit from continued skilled PT interventions to address impairments and progress towards PLOF. Patient has met mobility goals at adequate level for discharge home; will continue to follow if pt continues acute stay to progress towards Mod I goals.       Recommendations for follow up therapy are one component of a multi-disciplinary discharge planning process, led by the attending physician.  Recommendations may be updated based on patient status, additional functional criteria and insurance authorization.  Follow Up Recommendations Follow physician's recommendations for discharge plan and follow up therapies    Assistance Recommended at Discharge Set up Supervision/Assistance  Patient can return home with the following  A little help with walking and/or transfers;A little help with bathing/dressing/bathroom;Assistance with cooking/housework;Assist for transportation;Help with stairs or ramp for entrance    Equipment Recommendations Rolling walker (2 wheels)  Recommendations for Other Services       Functional Status Assessment Patient  has had a recent decline in their functional status and demonstrates the ability to make significant improvements in function in a reasonable and predictable amount of time.     Precautions / Restrictions Precautions Precautions: Fall Restrictions Weight Bearing Restrictions: No      Mobility  Bed Mobility Overal bed mobility: Needs Assistance Bed Mobility: Supine to Sit     Supine to sit: Supervision     General bed mobility comments: Supervision for safety only, no physical assist required    Transfers Overall transfer level: Needs assistance Equipment used: Rolling walker (2 wheels) Transfers: Sit to/from Stand Sit to Stand: Min guard           General transfer comment: Min guard for safety only, no physical assist required, VCs for sequencing.    Ambulation/Gait Ambulation/Gait assistance: Min guard Gait Distance (Feet): 80 Feet Assistive device: Rolling walker (2 wheels) Gait Pattern/deviations: Step-to pattern Gait velocity: decreased     General Gait Details: Pt ambulated with RW and min guard assist, no physical assist required or overt LOB noted. Demonstrated step-to pattern with cues for proximity to RW, mild genu varum noted.  Stairs Stairs: Yes Stairs assistance: Min assist Stair Management: No rails, Backwards, Step to pattern, With walker Number of Stairs: 1 General stair comments: Pt educated on safe stair technique backwards with RW, verbalized understanding. Demonstrated safe technique with VCs for sequencing, min assist for steadying of RW, no overt LOB noted.  Wheelchair Mobility    Modified Rankin (Stroke Patients Only)       Balance Overall balance assessment: Needs assistance Sitting-balance support: Feet unsupported, No upper extremity supported Sitting balance-Leahy Scale: Normal     Standing balance support: Bilateral upper extremity supported, During functional activity, Reliant on assistive device for balance Standing  balance-Leahy Scale: Poor                               Pertinent Vitals/Pain Pain Assessment Pain Assessment: No/denies pain    Home Living Family/patient expects to be discharged to:: Private residence Living Arrangements: Spouse/significant other Available Help at Discharge: Family;Available 24 hours/day Type of Home: House Home Access: Stairs to enter   [employer] of Steps: 1   Home Layout: Able to live on main level with bedroom/bathroom;Full bath on main level Home Equipment: Grab bars - tub/shower;Cane - single point      Prior Function Prior Level of Function : Independent/Modified Independent;Driving             Mobility Comments: SPC during higher pain, ankle gives way ADLs Comments: ind     Hand Dominance        Extremity/Trunk Assessment   Upper Extremity Assessment Upper Extremity Assessment: Overall WFL for tasks assessed    Lower Extremity Assessment Lower Extremity Assessment: RLE deficits/detail;LLE deficits/detail RLE Deficits / Details: MMT ank DF/PF 4/5, no extensor lag noted upon straight leg raise RLE Sensation: WNL LLE Deficits / Details: MMT ank DF/PF 4/5 LLE Sensation: WNL    Cervical / Trunk Assessment Cervical / Trunk Assessment: Kyphotic  Communication   Communication: No difficulties  Cognition Arousal/Alertness: Awake/alert Behavior During Therapy: WFL for tasks assessed/performed Overall Cognitive Status: Within Functional Limits for tasks assessed                                          General Comments General comments (skin integrity, edema, etc.): Husband [name] present for sesssion    Exercises Total Joint Exercises Ankle Circles/Pumps: AROM, Both, 20 reps, Seated Quad Sets: AROM, Both, 5 reps, Seated Short Arc Quad: AROM, Right, 5 reps, Seated Heel Slides: AAROM, Right, 5 reps, Seated Hip ABduction/ADduction: AROM, Right, 5 reps, Seated Straight Leg Raises: AROM,  Right, 5 reps, Seated   Assessment/Plan    PT Assessment Patient needs continued PT services  PT Problem List Decreased strength;Decreased range of motion;Decreased activity tolerance;Decreased balance;Decreased mobility;Decreased coordination;Pain       PT Treatment Interventions DME instruction;Gait training;Stair training;Functional mobility training;Therapeutic activities;Therapeutic exercise;Balance training;Neuromuscular re-education;Patient/family education    PT Goals (Current goals can be found in the Care Plan section)  Acute Rehab PT Goals Patient Stated Goal: Walk to the mailbox PT Goal Formulation: With patient Time For Goal Achievement: 10/16/21 Potential to Achieve Goals: Good    Frequency 7X/week     Co-evaluation               AM-PAC PT "6 Clicks" Mobility  Outcome Measure Help needed turning from your back to your side while in a flat bed without using bedrails?: None Help needed moving from lying on your back to sitting on the side of a flat bed without using bedrails?: None Help needed moving to and from a bed to a chair (including a wheelchair)?: A Little Help needed standing up from a chair using your arms (e.g., wheelchair or bedside chair)?: A Little Help needed to walk in hospital room?: A Little Help needed climbing 3-5 steps with a railing? : A Little 6 Click Score: 20    End of Session Equipment Utilized During Treatment: Gait belt Activity Tolerance: Patient tolerated treatment well;No increased pain Patient left: in chair;with  call bell/phone within reach;with family/visitor present Nurse Communication: Mobility status PT Visit Diagnosis: Difficulty in walking, not elsewhere classified (R26.2)    Time: 1202-1227 PT Time Calculation (min) (ACUTE ONLY): 25 min   Charges:   PT Evaluation $PT Eval Low Complexity: 1 Low PT Treatments $Gait Training: 8-22 mins        Coolidge Breeze, PT, DPT [facility] Office:  [phone] Pager: [phone]  Coolidge Breeze 10/09/2021, 12:37 PM

## 2021-10-09 NOTE — Op Note (Signed)
10/09/2021  8:58 AM  PATIENT:  [name]    PRE-OPERATIVE DIAGNOSIS: Right anteromedial knee osteoarthritis  POST-OPERATIVE DIAGNOSIS:  Same  PROCEDURE:  Unicompartmental Knee Arthroplasty  SURGEON:  Eulas Post, MD  PHYSICIAN ASSISTANT: Janine Ores, PA-C, present and scrubbed throughout the case, critical for completion in a timely fashion, and for retraction, instrumentation, and closure.  ANESTHESIA:   Spinal  ESTIMATED BLOOD LOSS: 75 mL  UNIQUE ASPECTS OF THE CASE: She had advanced eburnation on the medial compartment.  A little bit of changes under the medial facet of the patella, which were unloaded with the intra-articular correction of the severe varus.  Lateral side was completely intact and the lateral facet was also intact on the patella.  Femoral trochlea was well preserved.  I cut off of the 2 mm shim, but had a decent sized, I suspect some of this was from her pre-existing bone loss from the severe eburnation.  She ended up with a size 6 spacer.  PREOPERATIVE INDICATIONS:  [name] is a  66 y.o. female with a diagnosis of djd right knee who failed conservative measures and elected for surgical management.    The risks benefits and alternatives were discussed with the patient preoperatively including but not limited to the risks of infection, bleeding, nerve injury, cardiopulmonary complications, blood clots, the need for revision surgery, among others, and the patient was willing to proceed.  OPERATIVE IMPLANTS: [employer] mobile bearing medial compartment arthroplasty femur size small, tibia size B, bearing size 6.  OPERATIVE FINDINGS: Endstage grade 4 medial compartment osteoarthritis. No significant changes in the lateral or patellofemoral joint.  The ACL was intact.  OPERATIVE PROCEDURE: The patient was brought to the operating room placed in the supine position. Anesthesia was administered. IV antibiotics were given. The lower extremity was  placed in the legholder and prepped and draped in usual sterile fashion.  Time out was performed.  The leg was elevated and exsanguinated and the tourniquet was inflated. Anteromedial incision was performed, and I took care to preserve the MCL. Parapatellar incision was carried out, and the osteophytes were excised, along with the medial meniscus and a small portion of the fat pad.  The extra medullary tibial cutting jig was applied, using the spoon and the 26mm G-Clamp and the 2 mm shim, and I took care to protect the anterior cruciate ligament insertion and the tibial spine. The medial collateral ligament was also protected, and I resected my proximal tibia, matching the anatomic slope.   The proximal tibial bony cut was removed in one piece, and I turned my attention to the femur.  The intramedullary femoral rod was placed using the drill, and then using the appropriate reference, I assembled the femoral jig, setting my posterior cutting block. I resected my posterior femur, used the 0 spigot for the anterior femur, and then measured my gap.   I then used the appropriate mill to match the extension gap to the flexion gap. The second milling was at a 3.  The gaps were then measured again with the appropriate feeler gauges. Once I had balanced flexion and extension gaps, I then completed the preparation of the femur.  I milled off the anterior aspect of the distal femur to prevent impingement. I also exposed the tibia, and selected the above-named component, and then used the cutting jig to prepare the keel slot on the tibia. I also used the awl to curette out the bone to complete the preparation of  the keel. The back wall was intact.  I then placed trial components, and it was found to have excellent motion, and appropriate balance.  I then cemented the components into place, cementing the tibia first, removing all excess cement, and then cementing the femur.  All loose cement was removed.  The  real polyethylene insert was applied manually, and the knee was taken through functional range of motion, and found to have excellent stability and restoration of joint motion, with excellent balance.  The wounds were irrigated copiously, and the parapatellar tissue closed with Vicryl, followed by Vicryl for the subcutaneous tissue, with routine closure with Steri-Strips and sterile gauze.  The tourniquet was released, and the patient was awakened and extubated and returned to PACU in stable and satisfactory condition. There were no complications.

## 2021-10-09 NOTE — Anesthesia Procedure Notes (Signed)
Procedure Name: MAC Date/Time: 10/09/2021 7:30 AM Performed by: Claudia Desanctis, CRNA Pre-anesthesia Checklist: Patient identified, Emergency Drugs available, Suction available and Patient being monitored Patient Re-evaluated:Patient Re-evaluated prior to induction Oxygen Delivery Method: Simple face mask

## 2021-10-09 NOTE — Anesthesia Postprocedure Evaluation (Signed)
Anesthesia Post Note  Patient: [name]  Procedure(s) Performed: UNICOMPARTMENTAL KNEE (Right: Knee)     Patient location during evaluation: PACU Anesthesia Type: Spinal Level of consciousness: awake and alert Pain management: pain level controlled Vital Signs Assessment: post-procedure vital signs reviewed and stable Respiratory status: spontaneous breathing and respiratory function stable Cardiovascular status: blood pressure returned to baseline and stable Postop Assessment: spinal receding Anesthetic complications: no   No notable events documented.  Last Vitals:  Vitals:   10/09/21 1000 10/09/21 1013  BP: (!) 147/84 139/79  Pulse: 72 74  Resp: 13 16  Temp:  37.7 C  SpO2: 93% 100%    Last Pain:  Vitals:   10/09/21 1013  TempSrc:   PainSc: 0-No pain    LLE Motor Response: Purposeful movement (10/09/21 1007) LLE Sensation: Tingling (10/09/21 1007) RLE Motor Response: Purposeful movement (10/09/21 1007) RLE Sensation: Tingling (10/09/21 1007)      Olimpia Tinch DANIEL

## 2021-10-09 NOTE — Transfer of Care (Signed)
Immediate Anesthesia Transfer of Care Note  Patient: [name]  Procedure(s) Performed: UNICOMPARTMENTAL KNEE (Right: Knee)  Patient Location: PACU  Anesthesia Type:Spinal  Level of Consciousness: awake, alert , oriented and patient cooperative  Airway & Oxygen Therapy: Patient Spontanous Breathing and Patient connected to face mask  Post-op Assessment: Report given to RN and Post -op Vital signs reviewed and stable  Post vital signs: Reviewed and stable  Last Vitals:  Vitals Value Taken Time  BP 119/67 10/09/21 0921  Temp    Pulse 84 10/09/21 0921  Resp 23 10/09/21 0921  SpO2 91 % 10/09/21 0921  Vitals shown include unvalidated device data.  Last Pain:  Vitals:   10/09/21 0649  TempSrc:   PainSc: 0-No pain         Complications: No notable events documented.

## 2021-10-09 NOTE — Discharge Instructions (Signed)
INSTRUCTIONS AFTER JOINT REPLACEMENT   Remove items at home which could result in a fall. This includes throw rugs or furniture in walking pathways ICE to the affected joint every three hours while awake for 30 minutes at a time, for at least the first 3-5 days, and then as needed for pain and swelling.  Continue to use ice for pain and swelling. You may notice swelling that will progress down to the foot and ankle.  This is normal after surgery.  Elevate your leg when you are not up walking on it.   Continue to use the breathing machine you got in the hospital (incentive spirometer) which will help keep your temperature down.  It is common for your temperature to cycle up and down following surgery, especially at night when you are not up moving around and exerting yourself.  The breathing machine keeps your lungs expanded and your temperature down.   DIET:  As you were doing prior to hospitalization, we recommend a well-balanced diet.  DRESSING / WOUND CARE / SHOWERING  You may shower 3 days after surgery, but keep the wounds dry during showering.  You may use an occlusive plastic wrap (Press'n Seal for example), NO SOAKING/SUBMERGING IN THE BATHTUB.  If the bandage gets wet, change with a clean dry gauze.  If the incision gets wet, pat the wound dry with a clean towel.  ACTIVITY  Increase activity slowly as tolerated, but follow the weight bearing instructions below.   No driving for 6 weeks or until further direction given by your physician.  You cannot drive while taking narcotics.  No lifting or carrying greater than 10 lbs. until further directed by your surgeon. Avoid periods of inactivity such as sitting longer than an hour when not asleep. This helps prevent blood clots.  You may return to work once you are authorized by your doctor.     WEIGHT BEARING   Weight bearing as tolerated with assist device (walker, cane, etc) as directed, use it as long as suggested by your surgeon or  therapist, typically at least 4-6 weeks.   EXERCISES  Results after joint replacement surgery are often greatly improved when you follow the exercise, range of motion and muscle strengthening exercises prescribed by your doctor. Safety measures are also important to protect the joint from further injury. Any time any of these exercises cause you to have increased pain or swelling, decrease what you are doing until you are comfortable again and then slowly increase them. If you have problems or questions, call your caregiver or physical therapist for advice.   Rehabilitation is important following a joint replacement. After just a few days of immobilization, the muscles of the leg can become weakened and shrink (atrophy).  These exercises are designed to build up the tone and strength of the thigh and leg muscles and to improve motion. Often times heat used for twenty to thirty minutes before working out will loosen up your tissues and help with improving the range of motion but do not use heat for the first two weeks following surgery (sometimes heat can increase post-operative swelling).   These exercises can be done on a training (exercise) mat, on the floor, on a table or on a bed. Use whatever works the best and is most comfortable for you.    Use music or television while you are exercising so that the exercises are a pleasant break in your day. This will make your life better with the exercises acting   as a break in your routine that you can look forward to.   Perform all exercises about fifteen times, three times per day or as directed.  You should exercise both the operative leg and the other leg as well.  Exercises include:   Quad Sets - Tighten up the muscle on the front of the thigh (Quad) and hold for 5-10 seconds.   Straight Leg Raises - With your knee straight (if you were given a brace, keep it on), lift the leg to 60 degrees, hold for 3 seconds, and slowly lower the leg.  Perform this  exercise against resistance later as your leg gets stronger.  Leg Slides: Lying on your back, slowly slide your foot toward your buttocks, bending your knee up off the floor (only go as far as is comfortable). Then slowly slide your foot back down until your leg is flat on the floor again.  Angel Wings: Lying on your back spread your legs to the side as far apart as you can without causing discomfort.  Hamstring Strength:  Lying on your back, push your heel against the floor with your leg straight by tightening up the muscles of your buttocks.  Repeat, but this time bend your knee to a comfortable angle, and push your heel against the floor.  You may put a pillow under the heel to make it more comfortable if necessary.   A rehabilitation program following joint replacement surgery can speed recovery and prevent re-injury in the future due to weakened muscles. Contact your doctor or a physical therapist for more information on knee rehabilitation.    CONSTIPATION  Constipation is defined medically as fewer than three stools per week and severe constipation as less than one stool per week.  Even if you have a regular bowel pattern at home, your normal regimen is likely to be disrupted due to multiple reasons following surgery.  Combination of anesthesia, postoperative narcotics, change in appetite and fluid intake all can affect your bowels.   YOU MUST use at least one of the following options; they are listed in order of increasing strength to get the job done.  They are all available over the counter, and you may need to use some, POSSIBLY even all of these options:    Drink plenty of fluids (prune juice may be helpful) and high fiber foods Colace 100 mg by mouth twice a day  Senokot for constipation as directed and as needed Dulcolax (bisacodyl), take with full glass of water  Miralax (polyethylene glycol) once or twice a day as needed.  If you have tried all these things and are unable to have a  bowel movement in the first 3-4 days after surgery call either your surgeon or your primary doctor.    If you experience loose stools or diarrhea, hold the medications until you stool forms back up.  If your symptoms do not get better within 1 week or if they get worse, check with your doctor.  If you experience "the worst abdominal pain ever" or develop nausea or vomiting, please contact the office immediately for further recommendations for treatment.   ITCHING:  If you experience itching with your medications, try taking only a single pain pill, or even half a pain pill at a time.  You can also use Benadryl over the counter for itching or also to help with sleep.   TED HOSE STOCKINGS:  Use stockings on both legs until for at least 2 weeks or as directed by   physician office. They may be removed at night for sleeping.  MEDICATIONS:  See your medication summary on the "After Visit Summary" that nursing will review with you.  You may have some home medications which will be placed on hold until you complete the course of blood thinner medication.  It is important for you to complete the blood thinner medication as prescribed.  PRECAUTIONS:  If you experience chest pain or shortness of breath - call 911 immediately for transfer to the hospital emergency department.   If you develop a fever greater that 101 F, purulent drainage from wound, increased redness or drainage from wound, foul odor from the wound/dressing, or calf pain - CONTACT YOUR SURGEON.                                                   FOLLOW-UP APPOINTMENTS:  If you do not already have a post-op appointment, please call the office for an appointment to be seen by your surgeon.  Guidelines for how soon to be seen are listed in your "After Visit Summary", but are typically between 1-4 weeks after surgery.  OTHER INSTRUCTIONS:   Knee Replacement:  Do not place pillow under knee, focus on keeping the knee straight while resting.    POST-OPERATIVE OPIOID TAPER INSTRUCTIONS: It is important to wean off of your opioid medication as soon as possible. If you do not need pain medication after your surgery it is ok to stop day one. Opioids include: Codeine, Hydrocodone(Norco, Vicodin), Oxycodone(Percocet, oxycontin) and hydromorphone amongst others.  Long term and even short term use of opiods can cause: Increased pain response Dependence Constipation Depression Respiratory depression And more.  Withdrawal symptoms can include Flu like symptoms Nausea, vomiting And more Techniques to manage these symptoms Hydrate well Eat regular healthy meals Stay active Use relaxation techniques(deep breathing, meditating, yoga) Do Not substitute Alcohol to help with tapering If you have been on opioids for less than two weeks and do not have pain than it is ok to stop all together.  Plan to wean off of opioids This plan should start within one week post op of your joint replacement. Maintain the same interval or time between taking each dose and first decrease the dose.  Cut the total daily intake of opioids by one tablet each day Next start to increase the time between doses. The last dose that should be eliminated is the evening dose.   ** There is a national back order of oxycodone right now so you have been sent home with Percocet (oxycodone 5 mg combined with tylenol 325 mg). If you are taking tylenol along with the Percocet, make sure you are calculating your total tylenol dosage per 24 hours, this should never exceed 4000 mg in 24 hours**  MAKE SURE YOU:  Understand these instructions.  Get help right away if you are not doing well or get worse.    Thank you for letting us be a part of your medical care team.  It is a privilege we respect greatly.  We hope these instructions will help you stay on track for a fast and full recovery!

## 2021-10-09 NOTE — Interval H&P Note (Signed)
History and Physical Interval Note:  10/09/2021 6:58 AM  [name]  has presented today for surgery, with the diagnosis of djd right knee.  The various methods of treatment have been discussed with the patient and family. After consideration of risks, benefits and other options for treatment, the patient has consented to  Procedure(s): UNICOMPARTMENTAL KNEE (Right) as a surgical intervention.  The patient's history has been reviewed, patient examined, no change in status, stable for surgery.  I have reviewed the patient's chart and labs.  Questions were answered to the patient's satisfaction.     Eulas Post

## 2021-10-09 NOTE — Anesthesia Procedure Notes (Signed)
Spinal  Patient location during procedure: OR Start time: 10/09/2021 7:14 AM End time: 10/09/2021 7:24 AM Reason for block: surgical anesthesia Staffing Performed: anesthesiologist  Anesthesiologist: Duane Boston, MD Preanesthetic Checklist Completed: patient identified, IV checked, risks and benefits discussed, surgical consent, monitors and equipment checked, pre-op evaluation and timeout performed Spinal Block Patient position: sitting Prep: DuraPrep Patient monitoring: cardiac monitor, continuous pulse ox and blood pressure Approach: midline Location: L2-3 Injection technique: single-shot Needle Needle type: Pencan  Needle gauge: 24 G Needle length: 9 cm Assessment Events: CSF return Additional Notes Functioning IV was confirmed and monitors were applied. Sterile prep and drape, including hand hygiene and sterile gloves were used. The patient was positioned and the spine was prepped. The skin was anesthetized with lidocaine.  Free flow of clear CSF was obtained prior to injecting local anesthetic into the CSF.  The spinal needle aspirated freely following injection.  The needle was carefully withdrawn.  The patient tolerated the procedure well.

## 2021-10-09 NOTE — Anesthesia Procedure Notes (Signed)
Anesthesia Regional Block: Adductor canal block   Pre-Anesthetic Checklist: , timeout performed,  Correct Patient, Correct Site, Correct Laterality,  Correct Procedure, Correct Position, site marked,  Risks and benefits discussed,  Surgical consent,  Pre-op evaluation,  At surgeon's request and post-op pain management  Laterality: Right  Prep: chloraprep       Needles:  Injection technique: Single-shot  Needle Type: Stimulator Needle - 80     Needle Length: 10cm  Needle Gauge: 21     Additional Needles:   Narrative:  Start time: 10/09/2021 6:58 AM End time: 10/09/2021 6:48 AM Injection made incrementally with aspirations every 5 mL.  Performed by: Personally  Anesthesiologist: Duane Boston, MD

## 2021-10-09 NOTE — Interval H&P Note (Signed)
History and Physical Interval Note:  10/09/2021 7:11 AM  [name]  has presented today for surgery, with the diagnosis of djd right knee.  The various methods of treatment have been discussed with the patient and family. After consideration of risks, benefits and other options for treatment, the patient has consented to  Procedure(s): UNICOMPARTMENTAL KNEE (Right) as a surgical intervention.  The patient's history has been reviewed, patient examined, no change in status, stable for surgery.  I have reviewed the patient's chart and labs.  Questions were answered to the patient's satisfaction.     Eulas Post

## 2021-10-11 ENCOUNTER — Encounter (HOSPITAL_COMMUNITY): Payer: Self-pay | Admitting: Orthopedic Surgery

## 2021-10-11 NOTE — Therapy (Addendum)
OUTPATIENT PHYSICAL THERAPY LOWER EXTREMITY EVALUATION   Patient Name: [name] MRN: [id number] DOB:[birth year], 66 y.o., female Today's Date: 10/12/2021   PT End of Session - 10/12/21 1109     Visit Number 1    Date for PT Re-Evaluation 12/21/21    PT Start Time 1108    PT Stop Time 1146    PT Time Calculation (min) 38 min    Activity Tolerance Patient tolerated treatment well;Patient limited by pain    Behavior During Therapy WFL for tasks assessed/performed   Drowsy            Past Medical History:  Diagnosis Date   Anemia    Anxiety    Arthritis    Headache    Hyperlipemia    Hypertension    Pre-diabetes    Scoliosis    Past Surgical History:  Procedure Laterality Date   BREAST BIOPSY     CESAREAN SECTION     x3   CHOLECYSTECTOMY, LAPAROSCOPIC     ESOPHAGOGASTRODUODENOSCOPY     HEMORRHOID SURGERY     PARTIAL KNEE ARTHROPLASTY Right 10/09/2021   Procedure: UNICOMPARTMENTAL KNEE;  Surgeon: Marchia Bond, MD;  Location: [facility];  Service: Orthopedics;  Laterality: Right;   TOTAL KNEE ARTHROPLASTY Left 8/14       Patient Active Problem List   Diagnosis Date Noted   Migraine without status migrainosus, not intractable 08/20/2017   Gastroesophageal reflux disease 06/16/2017   Vertigo 01/24/2017   Controlled substance agreement signed 10/17/2016   Vitamin D deficiency 06/27/2016   Anxiety 04/17/2016   Spinal stenosis of cervical region 04/05/2016   Diabetes mellitus ([place]) 11/16/2012   HLD (hyperlipidemia) 11/16/2012   Encounter for procedure for purposes other than remedying health state, unspecified 11/16/2012   Hyperlipidemia associated with type 2 diabetes mellitus ([place]) 11/16/2012   Primary osteoarthritis of one knee 09/30/2012   OBESITY 06/09/2007   HYPERTENSION, SEVERE 06/09/2007   COUGH 06/09/2007    PCP: Hermina Barters  REFERRING PROVIDER: Marchia Bond  REFERRING DIAG: Rt Knee Arthroplasty   THERAPY DIAG:  No diagnosis  found.  Rationale for Evaluation and Treatment Rehabilitation  ONSET DATE: 10/09/21  SUBJECTIVE:   SUBJECTIVE STATEMENT: Patient states her R knee pain levels at a 6/10, and is fatigued and drowsy due to taking Percocet.  She is walking with Rw but walking with a cane at home.   PERTINENT HISTORY: L total knee  Cholecystectomy R partial 10/09/21   PAIN:  Are you having pain? Yes: NPRS scale: 6/10 Pain location: R knee Pain description: shooting, stabbing Aggravating factors: moving, bending, flexing knee Relieving factors: pain meds, exercises, ice  PRECAUTIONS: None  WEIGHT BEARING RESTRICTIONS No  FALLS:  Has patient fallen in last 6 months? Yes. Number of falls once before surgery, tripping over dog   LIVING ENVIRONMENT: Lives with: lives with their spouse Lives in: House/apartment Stairs: Yes: External: 1 steps; none Has following equipment at home: Single point cane and Walker - 2 wheeled  OCCUPATION: retired  PLOF: Independent  PATIENT GOALS walk again, no pain, gain strength   OBJECTIVE:   DIAGNOSTIC FINDINGS:  EXAM: PORTABLE RIGHT KNEE - 1-2 VIEW   COMPARISON:  None Available.   FINDINGS: Sequelae of medial compartment arthroplasty are identified. The prosthetic components appear well-positioned. No acute fracture or dislocation is identified. A small amount of fluid and postoperative gas are noted in the knee joint.   IMPRESSION: Status post unicompartmental knee arthroplasty.  PATIENT SURVEYS:  FOTO 32  COGNITION:  Overall cognitive status: Within functional limits for tasks assessed and Impaired: drowsy      SENSATION: WFL  EDEMA:  Circumferential: Knee 46cm on R, 42cm  on L  MUSCLE LENGTH: hamstring tightness   POSTURE: rounded shoulders, forward head, flexed trunk , and weight shift left   LOWER EXTREMITY ROM: limited by pain took in sitting position   Active ROM Right eval Left eval  Hip flexion    Hip extension    Hip  abduction    Hip adduction    Hip internal rotation    Hip external rotation    Knee flexion 80d   Knee extension 3d lag   Ankle dorsiflexion    Ankle plantarflexion    Ankle inversion    Ankle eversion     (Blank rows = not tested)  LOWER EXTREMITY MMT:  MMT Right eval Left eval  Hip flexion 2-   Hip extension    Hip abduction 3   Hip adduction    Hip internal rotation    Hip external rotation    Knee flexion 2-   Knee extension 2-   Ankle dorsiflexion 4   Ankle plantarflexion 4   Ankle inversion    Ankle eversion     (Blank rows = not tested)   FUNCTIONAL TESTS:  5 times sit to stand: 31.77s Timed up and go (TUG): 30.69s  GAIT: Distance walked: 25ft Assistive device utilized: Single point cane and Walker - 2 wheeled Level of assistance: Complete Independence Comments: Pt is using cane for household ambulation and walker outside. Antalgic gait, decrease stride and step length on R, decreased stance time on R, slow gait, Trendelenburg    TODAY'S TREATMENT: Gait training with SPC, home and self management, review of HEP   PATIENT EDUCATION:  Education details: POC, how to use cane Person educated: Patient and Spouse Education method: Medical illustrator Education comprehension: verbalized understanding and returned demonstration   HOME EXERCISE PROGRAM: [id number]  ASSESSMENT:  CLINICAL IMPRESSION: Patient is a 66 y.o. female who was seen today for physical therapy evaluation and treatment for partial knee surgery on RLE. Patient enters PT feeling groggy and drowsy due to taking Percocet. She is able to walk using a rolling walker and is using a cane inside the house. Patient is limited in ROM and strength mostly due to pain. She will benefit from skilled PT intervention to address R knee strength and ROM deficits to be able to ambulate and complete household activities and ADLs without pain.    OBJECTIVE IMPAIRMENTS Abnormal gait, decreased  activity tolerance, difficulty walking, decreased ROM, decreased strength, and obesity.   ACTIVITY LIMITATIONS carrying, lifting, bending, sitting, standing, squatting, stairs, and locomotion level  PARTICIPATION LIMITATIONS: cleaning, laundry, driving, shopping, community activity, and walking, household chores  PERSONAL FACTORS Age, Past/current experiences, Sex, and 1-2 comorbidities: HTN, prediabetes, hyperlipidemia  are also affecting patient's functional outcome.   REHAB POTENTIAL: Good  CLINICAL DECISION MAKING: Stable/uncomplicated  EVALUATION COMPLEXITY: Low   GOALS: Goals reviewed with patient? Yes  SHORT TERM GOALS: Target date: 11/23/21 Patient will be independent with initial HEP. Goal status: INITIAL   LONG TERM GOALS: Target date: 12/21/21  1.  Patient will report at least 75% improvement in R knee pain to improve QOL. Goal status: INITIAL  2.  Patient will demonstrate improved R knee AROM to >/= 0-120 deg to allow for normal gait and stair mechanics. Goal status: INITIAL  3.  Patient will demonstrate improved functional LE  strength as demonstrated by >= 4/5 to be able to walk without pain. Goal status: INITIAL  4.  Patient will be able to ambulate 600' without assistive device and normal gait pattern without increased pain to access community.  Goal status: INITIAL  5. Patient will be able to ascend/descend stairs with 1 HR and reciprocal step pattern safely to access home and community.  Goal status: INITIAL  7.  Patient will report 69 on FOTO (patient reported outcome measure) to demonstrate improved functional ability. Goal status: INITIAL     PLAN: PT FREQUENCY: 2x/week  PT DURATION: 10 weeks  PLANNED INTERVENTIONS: Therapeutic exercises, Therapeutic activity, Neuromuscular re-education, Balance training, Gait training, Patient/Family education, Joint mobilization, Stair training, Electrical stimulation, Cryotherapy, Moist heat, Vasopneumatic  device, Ionotophoresis 4mg /ml Dexamethasone, and Manual therapy  PLAN FOR NEXT SESSION: Review HEP, AAROM for R knee, gait training   Andris Baumann, PT 10/12/2021, 12:04 PM

## 2021-10-12 ENCOUNTER — Ambulatory Visit: Payer: Medicare Other | Attending: Orthopedic Surgery

## 2021-10-12 DIAGNOSIS — M25561 Pain in right knee: Secondary | ICD-10-CM | POA: Diagnosis present

## 2021-10-12 DIAGNOSIS — M6281 Muscle weakness (generalized): Secondary | ICD-10-CM | POA: Diagnosis present

## 2021-10-12 DIAGNOSIS — M25661 Stiffness of right knee, not elsewhere classified: Secondary | ICD-10-CM | POA: Diagnosis present

## 2021-10-12 DIAGNOSIS — R600 Localized edema: Secondary | ICD-10-CM | POA: Insufficient documentation

## 2021-10-12 DIAGNOSIS — R2689 Other abnormalities of gait and mobility: Secondary | ICD-10-CM | POA: Insufficient documentation

## 2021-10-15 ENCOUNTER — Ambulatory Visit: Payer: Medicare Other | Admitting: Physical Therapy

## 2021-10-15 ENCOUNTER — Encounter: Payer: Self-pay | Admitting: Physical Therapy

## 2021-10-15 DIAGNOSIS — M25661 Stiffness of right knee, not elsewhere classified: Secondary | ICD-10-CM

## 2021-10-15 DIAGNOSIS — M25561 Pain in right knee: Secondary | ICD-10-CM | POA: Diagnosis not present

## 2021-10-15 DIAGNOSIS — M6281 Muscle weakness (generalized): Secondary | ICD-10-CM

## 2021-10-15 DIAGNOSIS — R2689 Other abnormalities of gait and mobility: Secondary | ICD-10-CM

## 2021-10-15 NOTE — Therapy (Signed)
OUTPATIENT PHYSICAL THERAPY LOWER EXTREMITY Treatment    Patient Name: [name] MRN: [id number] DOB:05-20-1955, 66 y.o., female Today's Date: 10/15/2021   PT End of Session - 10/15/21 1508     Visit Number 2    Date for PT Re-Evaluation 12/21/21    PT Start Time 1448    PT Stop Time 1528    PT Time Calculation (min) 40 min    Activity Tolerance Patient tolerated treatment well;Patient limited by pain    Behavior During Therapy [facility] for tasks assessed/performed              Past Medical History:  Diagnosis Date   Anemia    Anxiety    Arthritis    Headache    Hyperlipemia    Hypertension    Pre-diabetes    Scoliosis    Past Surgical History:  Procedure Laterality Date   BREAST BIOPSY     CESAREAN SECTION     x3   CHOLECYSTECTOMY, LAPAROSCOPIC     ESOPHAGOGASTRODUODENOSCOPY     HEMORRHOID SURGERY     PARTIAL KNEE ARTHROPLASTY Right 10/09/2021   Procedure: UNICOMPARTMENTAL KNEE;  Surgeon: Teryl Lucy, MD;  Location: [facility];  Service: Orthopedics;  Laterality: Right;   TOTAL KNEE ARTHROPLASTY Left 8/14       Patient Active Problem List   Diagnosis Date Noted   Migraine without status migrainosus, not intractable 08/20/2017   Gastroesophageal reflux disease 06/16/2017   Vertigo 01/24/2017   Controlled substance agreement signed 10/17/2016   Vitamin D deficiency 06/27/2016   Anxiety 04/17/2016   Spinal stenosis of cervical region 04/05/2016   Diabetes mellitus (HCC) 11/16/2012   HLD (hyperlipidemia) 11/16/2012   Encounter for procedure for purposes other than remedying health state, unspecified 11/16/2012   Hyperlipidemia associated with type 2 diabetes mellitus (HCC) 11/16/2012   Primary osteoarthritis of one knee 09/30/2012   OBESITY 06/09/2007   HYPERTENSION, SEVERE 06/09/2007   COUGH 06/09/2007    PCP: Katheren Puller  REFERRING PROVIDER: Teryl Lucy  REFERRING DIAG: Rt Knee Arthroplasty   THERAPY DIAG:  Acute pain of right knee  Other  abnormalities of gait and mobility  Muscle weakness (generalized)  Stiffness of right knee, not elsewhere classified  Rationale for Evaluation and Treatment Rehabilitation  ONSET DATE: 10/09/21  SUBJECTIVE:   SUBJECTIVE STATEMENT:  I'm having a lot of swelling and pain my knee. My HEP is not going well, I'm feeling washed out and weak. I don't get going until lunch.    PERTINENT HISTORY: L total knee  Cholecystectomy R partial 10/09/21   PAIN:  Are you having pain? Yes: NPRS scale: 5-6/10 Pain location: R knee Pain description: shooting, stabbing Aggravating factors: moving, bending, flexing knee Relieving factors: pain meds, exercises, ice  PRECAUTIONS: None  WEIGHT BEARING RESTRICTIONS No  FALLS:  Has patient fallen in last 6 months? Yes. Number of falls once before surgery, tripping over dog   LIVING ENVIRONMENT: Lives with: lives with their spouse Lives in: House/apartment Stairs: Yes: External: 1 steps; none Has following equipment at home: Single point cane and Walker - 2 wheeled  OCCUPATION: retired  PLOF: Independent  PATIENT GOALS walk again, no pain, gain strength    COGNITION:  Overall cognitive status: Within functional limits for tasks assessed and Impaired: drowsy        TODAY'S TREATMENT:  10/15/21  Quad sets 1x15 3 second holds  SLRs 1x15 R LE Hamstring slides 1x15 SAQs 1x15 3 second holds   LAQs 1x15 3  second holds  Self-overpressure for knee flexion10x10 holds   Bike seat 13 partial rotations x6 minutes   Ice at EOS (with ongoing education for pain management)      PATIENT EDUCATION:  Education details: pain and muscle spasm management at home, HEP updates Person educated: Patient and Spouse Education method: Medical illustrator Education comprehension: verbalized understanding and returned demonstration   HOME EXERCISE PROGRAM: [id number]  ASSESSMENT:  CLINICAL IMPRESSION:  Ms. Mack arrives today doing  OK. We worked on basic exercises for strength and ROM in post-op knee as tolerated. She was a bit on and off agitated with me through the session today but able to redirect easily. Will continue to progress as able.   OBJECTIVE IMPAIRMENTS Abnormal gait, decreased activity tolerance, difficulty walking, decreased ROM, decreased strength, and obesity.   ACTIVITY LIMITATIONS carrying, lifting, bending, sitting, standing, squatting, stairs, and locomotion level  PARTICIPATION LIMITATIONS: cleaning, laundry, driving, shopping, community activity, and walking, household chores  PERSONAL FACTORS Age, Past/current experiences, Sex, and 1-2 comorbidities: HTN, prediabetes, hyperlipidemia  are also affecting patient's functional outcome.   REHAB POTENTIAL: Good  CLINICAL DECISION MAKING: Stable/uncomplicated  EVALUATION COMPLEXITY: Low   GOALS: Goals reviewed with patient? Yes  SHORT TERM GOALS: Target date: 11/23/21 Patient will be independent with initial HEP. Goal status: INITIAL   LONG TERM GOALS: Target date: 12/21/21  1.  Patient will report at least 75% improvement in R knee pain to improve QOL. Goal status: INITIAL  2.  Patient will demonstrate improved R knee AROM to >/= 0-120 deg to allow for normal gait and stair mechanics. Goal status: INITIAL  3.  Patient will demonstrate improved functional LE strength as demonstrated by >= 4/5 to be able to walk without pain. Goal status: INITIAL  4.  Patient will be able to ambulate 600' without assistive device and normal gait pattern without increased pain to access community.  Goal status: INITIAL  5. Patient will be able to ascend/descend stairs with 1 HR and reciprocal step pattern safely to access home and community.  Goal status: INITIAL  7.  Patient will report 71 on FOTO (patient reported outcome measure) to demonstrate improved functional ability. Goal status: INITIAL     PLAN: PT FREQUENCY: 2x/week  PT DURATION: 10  weeks  PLANNED INTERVENTIONS: Therapeutic exercises, Therapeutic activity, Neuromuscular re-education, Balance training, Gait training, Patient/Family education, Joint mobilization, Stair training, Electrical stimulation, Cryotherapy, Moist heat, Vasopneumatic device, Ionotophoresis 4mg /ml Dexamethasone, and Manual therapy  PLAN FOR NEXT SESSION: Review HEP, AAROM for R knee, gait training- did she bring picture of her bike at home?    PT DPT PN2  10/15/2021, 3:45 PM

## 2021-10-17 ENCOUNTER — Encounter: Payer: Self-pay | Admitting: Physical Therapy

## 2021-10-17 ENCOUNTER — Ambulatory Visit: Payer: Medicare Other

## 2021-10-17 ENCOUNTER — Ambulatory Visit: Payer: Medicare Other | Admitting: Physical Therapy

## 2021-10-17 DIAGNOSIS — M25561 Pain in right knee: Secondary | ICD-10-CM | POA: Diagnosis not present

## 2021-10-17 DIAGNOSIS — R2689 Other abnormalities of gait and mobility: Secondary | ICD-10-CM

## 2021-10-17 DIAGNOSIS — M25661 Stiffness of right knee, not elsewhere classified: Secondary | ICD-10-CM

## 2021-10-17 DIAGNOSIS — R600 Localized edema: Secondary | ICD-10-CM

## 2021-10-17 DIAGNOSIS — M6281 Muscle weakness (generalized): Secondary | ICD-10-CM

## 2021-10-17 NOTE — Therapy (Signed)
OUTPATIENT PHYSICAL THERAPY LOWER EXTREMITY Treatment    Patient Name: [name] MRN: [id number] DOB:10-Sep-1955, 66 y.o., female Today's Date: 10/17/2021   PT End of Session - 10/17/21 1514     Visit Number 3    PT Start Time 1515    PT Stop Time 1600    PT Time Calculation (min) 45 min    Activity Tolerance Patient tolerated treatment well;Patient limited by pain    Behavior During Therapy WFL for tasks assessed/performed              Past Medical History:  Diagnosis Date   Anemia    Anxiety    Arthritis    Headache    Hyperlipemia    Hypertension    Pre-diabetes    Scoliosis    Past Surgical History:  Procedure Laterality Date   BREAST BIOPSY     CESAREAN SECTION     x3   CHOLECYSTECTOMY, LAPAROSCOPIC     ESOPHAGOGASTRODUODENOSCOPY     HEMORRHOID SURGERY     PARTIAL KNEE ARTHROPLASTY Right 10/09/2021   Procedure: UNICOMPARTMENTAL KNEE;  Surgeon: Teryl Lucy, MD;  Location: [facility];  Service: Orthopedics;  Laterality: Right;   TOTAL KNEE ARTHROPLASTY Left 8/14       Patient Active Problem List   Diagnosis Date Noted   Migraine without status migrainosus, not intractable 08/20/2017   Gastroesophageal reflux disease 06/16/2017   Vertigo 01/24/2017   Controlled substance agreement signed 10/17/2016   Vitamin D deficiency 06/27/2016   Anxiety 04/17/2016   Spinal stenosis of cervical region 04/05/2016   Diabetes mellitus (HCC) 11/16/2012   HLD (hyperlipidemia) 11/16/2012   Encounter for procedure for purposes other than remedying health state, unspecified 11/16/2012   Hyperlipidemia associated with type 2 diabetes mellitus (HCC) 11/16/2012   Primary osteoarthritis of one knee 09/30/2012   OBESITY 06/09/2007   HYPERTENSION, SEVERE 06/09/2007   COUGH 06/09/2007    PCP: Katheren Puller  REFERRING PROVIDER: Teryl Lucy  REFERRING DIAG: Rt Knee Arthroplasty   THERAPY DIAG:  No diagnosis found.  Rationale for Evaluation and Treatment  Rehabilitation  ONSET DATE: 10/09/21  SUBJECTIVE:   SUBJECTIVE STATEMENT:  Excellent. Went on a 10 min walk. today    PERTINENT HISTORY: L total knee  Cholecystectomy R partial 10/09/21   PAIN:  Are you having pain? Yes: NPRS scale: 3/10 Pain location: R knee Pain description: shooting, stabbing Aggravating factors: moving, bending, flexing knee Relieving factors: pain meds, exercises, ice  PRECAUTIONS: None  WEIGHT BEARING RESTRICTIONS No  FALLS:  Has patient fallen in last 6 months? Yes. Number of falls once before surgery, tripping over dog   LIVING ENVIRONMENT: Lives with: lives with their spouse Lives in: House/apartment Stairs: Yes: External: 1 steps; none Has following equipment at home: Single point cane and Walker - 2 wheeled  OCCUPATION: retired  PLOF: Independent  PATIENT GOALS walk again, no pain, gain strength    COGNITION:  Overall cognitive status: Within functional limits for tasks assessed and Impaired: drowsy        TODAY'S TREATMENT:  10/17/21 MT R knee PROM gentle   Quad sets x10  SAQ x10   NuStep L 2 x 6 min   LAQ R 2lb 2x10   Hamstring curls yellow 2x10   S2S 2x4 no UE   Seated march   GameReady low 34deg x10 min  10/15/21  Quad sets 1x15 3 second holds  SLRs 1x15 R LE Hamstring slides 1x15 SAQs 1x15 3 second holds  LAQs 1x15 3 second holds  Self-overpressure for knee flexion10x10 holds   Bike seat 13 partial rotations x6 minutes   Ice at EOS (with ongoing education for pain management)      PATIENT EDUCATION:  Education details: pain and muscle spasm management at home, HEP updates Person educated: Patient and Spouse Education method: Medical illustrator Education comprehension: verbalized understanding and returned demonstration   HOME EXERCISE PROGRAM: [id number]  ASSESSMENT:  CLINICAL IMPRESSION:  Ms. Hillmann arrives today doing OK. Pt enters ambulating with SPC. She was able to ambulate around  clinic without AD between interventons. Pt did well tolerating a more active treatment session completing interventions with light resistance. Some hesitation noted throughout session but pt able to calm down as she noticed that things didn't cause pain. Limited ROM with resistance HS curls. Pt able to complete sit to stance without UE assist. Good quad contraction with quad sets.   OBJECTIVE IMPAIRMENTS Abnormal gait, decreased activity tolerance, difficulty walking, decreased ROM, decreased strength, and obesity.   ACTIVITY LIMITATIONS carrying, lifting, bending, sitting, standing, squatting, stairs, and locomotion level  PARTICIPATION LIMITATIONS: cleaning, laundry, driving, shopping, community activity, and walking, household chores  PERSONAL FACTORS Age, Past/current experiences, Sex, and 1-2 comorbidities: HTN, prediabetes, hyperlipidemia  are also affecting patient's functional outcome.   REHAB POTENTIAL: Good  CLINICAL DECISION MAKING: Stable/uncomplicated  EVALUATION COMPLEXITY: Low   GOALS: Goals reviewed with patient? Yes  SHORT TERM GOALS: Target date: 11/23/21 Patient will be independent with initial HEP. Goal status: INITIAL   LONG TERM GOALS: Target date: 12/21/21  1.  Patient will report at least 75% improvement in R knee pain to improve QOL. Goal status: INITIAL  2.  Patient will demonstrate improved R knee AROM to >/= 0-120 deg to allow for normal gait and stair mechanics. Goal status: INITIAL  3.  Patient will demonstrate improved functional LE strength as demonstrated by >= 4/5 to be able to walk without pain. Goal status: INITIAL  4.  Patient will be able to ambulate 600' without assistive device and normal gait pattern without increased pain to access community.  Goal status: INITIAL  5. Patient will be able to ascend/descend stairs with 1 HR and reciprocal step pattern safely to access home and community.  Goal status: INITIAL  7.  Patient will report 42  on FOTO (patient reported outcome measure) to demonstrate improved functional ability. Goal status: INITIAL     PLAN: PT FREQUENCY: 2x/week  PT DURATION: 10 weeks  PLANNED INTERVENTIONS: Therapeutic exercises, Therapeutic activity, Neuromuscular re-education, Balance training, Gait training, Patient/Family education, Joint mobilization, Stair training, Electrical stimulation, Cryotherapy, Moist heat, Vasopneumatic device, Ionotophoresis 4mg /ml Dexamethasone, and Manual therapy  PLAN FOR NEXT SESSION: Review HEP, AAROM for R knee, gait training- did she bring picture of her bike at home?    PT DPT PN2  10/17/2021, 3:19 PM

## 2021-10-22 NOTE — Therapy (Signed)
OUTPATIENT PHYSICAL THERAPY LOWER EXTREMITY Treatment    Patient Name: [name] MRN: [id number] DOB:1955-08-03, 66 y.o., female Today's Date: 10/23/2021   PT End of Session - 10/23/21 1055     Visit Number 4    PT Start Time 1057    PT Stop Time 1145    PT Time Calculation (min) 48 min    Activity Tolerance Patient tolerated treatment well;Patient limited by pain    Behavior During Therapy WFL for tasks assessed/performed               Past Medical History:  Diagnosis Date   Anemia    Anxiety    Arthritis    Headache    Hyperlipemia    Hypertension    Pre-diabetes    Scoliosis    Past Surgical History:  Procedure Laterality Date   BREAST BIOPSY     CESAREAN SECTION     x3   CHOLECYSTECTOMY, LAPAROSCOPIC     ESOPHAGOGASTRODUODENOSCOPY     HEMORRHOID SURGERY     PARTIAL KNEE ARTHROPLASTY Right 10/09/2021   Procedure: UNICOMPARTMENTAL KNEE;  Surgeon: Teryl Lucy, MD;  Location: [facility];  Service: Orthopedics;  Laterality: Right;   TOTAL KNEE ARTHROPLASTY Left 8/14       Patient Active Problem List   Diagnosis Date Noted   Migraine without status migrainosus, not intractable 08/20/2017   Gastroesophageal reflux disease 06/16/2017   Vertigo 01/24/2017   Controlled substance agreement signed 10/17/2016   Vitamin D deficiency 06/27/2016   Anxiety 04/17/2016   Spinal stenosis of cervical region 04/05/2016   Diabetes mellitus (HCC) 11/16/2012   HLD (hyperlipidemia) 11/16/2012   Encounter for procedure for purposes other than remedying health state, unspecified 11/16/2012   Hyperlipidemia associated with type 2 diabetes mellitus (HCC) 11/16/2012   Primary osteoarthritis of one knee 09/30/2012   OBESITY 06/09/2007   HYPERTENSION, SEVERE 06/09/2007   COUGH 06/09/2007    PCP: Katheren Puller  REFERRING PROVIDER: Teryl Lucy  REFERRING DIAG: Rt Knee Arthroplasty   THERAPY DIAG:  Acute pain of right knee  Other abnormalities of gait and  mobility  Muscle weakness (generalized)  Stiffness of right knee, not elsewhere classified  Localized edema  Rationale for Evaluation and Treatment Rehabilitation  ONSET DATE: 10/09/21  SUBJECTIVE:   SUBJECTIVE STATEMENT:   Did exercises this morning and it swelled up a little bit, pain is a 3/10.   PERTINENT HISTORY: L total knee  Cholecystectomy R partial 10/09/21   PAIN:  Are you having pain? Yes: NPRS scale: 3/10 Pain location: R knee Pain description: shooting, stabbing Aggravating factors: moving, bending, flexing knee Relieving factors: pain meds, exercises, ice  PRECAUTIONS: None  WEIGHT BEARING RESTRICTIONS No  FALLS:  Has patient fallen in last 6 months? Yes. Number of falls once before surgery, tripping over dog   LIVING ENVIRONMENT: Lives with: lives with their spouse Lives in: House/apartment Stairs: Yes: External: 1 steps; none Has following equipment at home: Single point cane and Walker - 2 wheeled  OCCUPATION: retired  PLOF: Independent  PATIENT GOALS walk again, no pain, gain strength    COGNITION:  Overall cognitive status: Within functional limits for tasks assessed and Impaired: drowsy        TODAY'S TREATMENT: 10/22/21 Bike partial range  PROM flex/ext can get past 90d  Gastroc stretch  Hamstring curls red 2x10 Hamstring stretch 30s SLR 2# 2x10 LAQ 2# 2x10  Vaso 8m, 59F, med  10/17/21 MT R knee PROM gentle   Quad  sets x10  SAQ x10   NuStep L 2 x 6 min   LAQ R 2lb 2x10   Hamstring curls yellow 2x10   S2S 2x4 no UE   Seated march   GameReady low 34deg x10 min  10/15/21  Quad sets 1x15 3 second holds  SLRs 1x15 R LE Hamstring slides 1x15 SAQs 1x15 3 second holds   LAQs 1x15 3 second holds  Self-overpressure for knee flexion10x10 holds   Bike seat 13 partial rotations x6 minutes   Ice at EOS (with ongoing education for pain management)      PATIENT EDUCATION:  Education details: pain and muscle spasm  management at home, HEP updates Person educated: Patient and Spouse Education method: Medical illustrator Education comprehension: verbalized understanding and returned demonstration   HOME EXERCISE PROGRAM: [id number]  ASSESSMENT:  CLINICAL IMPRESSION: Patient returns to PT with mild pain and doing well with exercises at home. She is ambulating with a SPC and without it for short distances. Patient able to tolerate session without an increase in pain. She is only able to do the bike with partial range due to pain and stiffness. She is able to get >90d of knee flexion with PROM and is showing close to full with extension. Patient asked for ice at end of session due to feeling sore and to help with swelling.     OBJECTIVE IMPAIRMENTS Abnormal gait, decreased activity tolerance, difficulty walking, decreased ROM, decreased strength, and obesity.   ACTIVITY LIMITATIONS carrying, lifting, bending, sitting, standing, squatting, stairs, and locomotion level  PARTICIPATION LIMITATIONS: cleaning, laundry, driving, shopping, community activity, and walking, household chores  PERSONAL FACTORS Age, Past/current experiences, Sex, and 1-2 comorbidities: HTN, prediabetes, hyperlipidemia  are also affecting patient's functional outcome.   REHAB POTENTIAL: Good  CLINICAL DECISION MAKING: Stable/uncomplicated  EVALUATION COMPLEXITY: Low   GOALS: Goals reviewed with patient? Yes  SHORT TERM GOALS: Target date: 11/23/21 Patient will be independent with initial HEP. Goal status: INITIAL   LONG TERM GOALS: Target date: 12/21/21  1.  Patient will report at least 75% improvement in R knee pain to improve QOL. Goal status: INITIAL  2.  Patient will demonstrate improved R knee AROM to >/= 0-120 deg to allow for normal gait and stair mechanics. Goal status: INITIAL  3.  Patient will demonstrate improved functional LE strength as demonstrated by >= 4/5 to be able to walk without pain. Goal  status: INITIAL  4.  Patient will be able to ambulate 600' without assistive device and normal gait pattern without increased pain to access community.  Goal status: INITIAL  5. Patient will be able to ascend/descend stairs with 1 HR and reciprocal step pattern safely to access home and community.  Goal status: INITIAL  7.  Patient will report 76 on FOTO (patient reported outcome measure) to demonstrate improved functional ability. Goal status: INITIAL     PLAN: PT FREQUENCY: 2x/week  PT DURATION: 10 weeks  PLANNED INTERVENTIONS: Therapeutic exercises, Therapeutic activity, Neuromuscular re-education, Balance training, Gait training, Patient/Family education, Joint mobilization, Stair training, Electrical stimulation, Cryotherapy, Moist heat, Vasopneumatic device, Ionotophoresis 4mg /ml Dexamethasone, and Manual therapy  PLAN FOR NEXT SESSION: Review HEP, AAROM for R knee, gait training, Standing exercises (hip abd, marches, calf raises, heel taps)   , DPT 10/23/2021, 11:45 AM

## 2021-10-23 ENCOUNTER — Ambulatory Visit: Payer: Medicare Other | Admitting: Physical Therapy

## 2021-10-23 ENCOUNTER — Ambulatory Visit: Payer: Medicare Other

## 2021-10-23 DIAGNOSIS — M25561 Pain in right knee: Secondary | ICD-10-CM | POA: Diagnosis not present

## 2021-10-23 DIAGNOSIS — M6281 Muscle weakness (generalized): Secondary | ICD-10-CM

## 2021-10-23 DIAGNOSIS — M25661 Stiffness of right knee, not elsewhere classified: Secondary | ICD-10-CM

## 2021-10-23 DIAGNOSIS — R2689 Other abnormalities of gait and mobility: Secondary | ICD-10-CM

## 2021-10-23 DIAGNOSIS — R600 Localized edema: Secondary | ICD-10-CM

## 2021-10-25 ENCOUNTER — Ambulatory Visit: Payer: Medicare Other | Admitting: Physical Therapy

## 2021-10-26 ENCOUNTER — Ambulatory Visit: Payer: Medicare Other

## 2021-10-26 ENCOUNTER — Ambulatory Visit: Payer: Medicare Other | Admitting: Physical Therapy

## 2021-10-29 ENCOUNTER — Encounter: Payer: Self-pay | Admitting: Physical Therapy

## 2021-10-29 ENCOUNTER — Ambulatory Visit: Payer: Medicare Other | Admitting: Physical Therapy

## 2021-10-29 DIAGNOSIS — M25661 Stiffness of right knee, not elsewhere classified: Secondary | ICD-10-CM

## 2021-10-29 DIAGNOSIS — M25561 Pain in right knee: Secondary | ICD-10-CM | POA: Diagnosis not present

## 2021-10-29 DIAGNOSIS — M6281 Muscle weakness (generalized): Secondary | ICD-10-CM

## 2021-10-29 DIAGNOSIS — R600 Localized edema: Secondary | ICD-10-CM

## 2021-10-29 DIAGNOSIS — R2689 Other abnormalities of gait and mobility: Secondary | ICD-10-CM

## 2021-10-31 ENCOUNTER — Encounter: Payer: Self-pay | Admitting: Physical Therapy

## 2021-10-31 ENCOUNTER — Ambulatory Visit: Payer: Medicare Other | Admitting: Physical Therapy

## 2021-10-31 DIAGNOSIS — M25661 Stiffness of right knee, not elsewhere classified: Secondary | ICD-10-CM

## 2021-10-31 DIAGNOSIS — M6281 Muscle weakness (generalized): Secondary | ICD-10-CM

## 2021-10-31 DIAGNOSIS — R600 Localized edema: Secondary | ICD-10-CM

## 2021-10-31 DIAGNOSIS — M25561 Pain in right knee: Secondary | ICD-10-CM

## 2021-10-31 DIAGNOSIS — R2689 Other abnormalities of gait and mobility: Secondary | ICD-10-CM

## 2021-10-31 NOTE — Therapy (Signed)
OUTPATIENT PHYSICAL THERAPY LOWER EXTREMITY Treatment    Patient Name: [name] MRN: [id number] DOB:Mar 21, 1956, 66 y.o., female Today's Date: 10/31/2021   PT End of Session - 10/31/21 1220     Visit Number 6    Date for PT Re-Evaluation 12/21/21    PT Start Time 1146    PT Stop Time 1226    PT Time Calculation (min) 40 min    Activity Tolerance Patient tolerated treatment well;Patient limited by pain    Behavior During Therapy [facility] for tasks assessed/performed                 Past Medical History:  Diagnosis Date   Anemia    Anxiety    Arthritis    Headache    Hyperlipemia    Hypertension    Pre-diabetes    Scoliosis    Past Surgical History:  Procedure Laterality Date   BREAST BIOPSY     CESAREAN SECTION     x3   CHOLECYSTECTOMY, LAPAROSCOPIC     ESOPHAGOGASTRODUODENOSCOPY     HEMORRHOID SURGERY     PARTIAL KNEE ARTHROPLASTY Right 10/09/2021   Procedure: UNICOMPARTMENTAL KNEE;  Surgeon: Marchia Bond, MD;  Location: [facility];  Service: Orthopedics;  Laterality: Right;   TOTAL KNEE ARTHROPLASTY Left 8/14       Patient Active Problem List   Diagnosis Date Noted   Migraine without status migrainosus, not intractable 08/20/2017   Gastroesophageal reflux disease 06/16/2017   Vertigo 01/24/2017   Controlled substance agreement signed 10/17/2016   Vitamin D deficiency 06/27/2016   Anxiety 04/17/2016   Spinal stenosis of cervical region 04/05/2016   Diabetes mellitus ([place]) 11/16/2012   HLD (hyperlipidemia) 11/16/2012   Encounter for procedure for purposes other than remedying health state, unspecified 11/16/2012   Hyperlipidemia associated with type 2 diabetes mellitus ([place]) 11/16/2012   Primary osteoarthritis of one knee 09/30/2012   OBESITY 06/09/2007   HYPERTENSION, SEVERE 06/09/2007   COUGH 06/09/2007    PCP: Hermina Barters  REFERRING PROVIDER: Marchia Bond  REFERRING DIAG: Rt Knee Arthroplasty   THERAPY DIAG:  Acute pain of right  knee  Stiffness of right knee, not elsewhere classified  Other abnormalities of gait and mobility  Muscle weakness (generalized)  Localized edema  Rationale for Evaluation and Treatment Rehabilitation  ONSET DATE: 10/09/21  SUBJECTIVE:   SUBJECTIVE STATEMENT:   Patient arrives walking without her cane, minimal limp noted. She called to get her pain meds changed as her current medication makes her fell nauseous. Swelling remains, but overall, things are improving.  PERTINENT HISTORY: L total knee  Cholecystectomy R partial 10/09/21   PAIN:  Are you having pain? No  PRECAUTIONS: None  WEIGHT BEARING RESTRICTIONS No  FALLS:  Has patient fallen in last 6 months? Yes. Number of falls once before surgery, tripping over dog   LIVING ENVIRONMENT: Lives with: lives with their spouse Lives in: House/apartment Stairs: Yes: External: 1 steps; none Has following equipment at home: Single point cane and Walker - 2 wheeled  OCCUPATION: retired  PLOF: Independent  PATIENT GOALS walk again, no pain, gain strength    COGNITION:  Overall cognitive status: Within functional limits for tasks assessed and Impaired: drowsy        TODAY'S TREATMENT: 10/31/21 Nu-Step L4 x 6 minutes STM to R ITB and patellar mobs Supine SLR with 3# resistance, SLR with ER, 2 x 10 reps each. AAROM to R knee flex with contract/relax-105 Vaso-15, med press, R knee   10/29/21  Nu-Step L4 x 6 minutes Supine heel slides, SAQ, , SLR with 2#, SLR with hip IR, 10 reps STM to R ITB F/B passive stretch with strap, Also patellar mobs in all directions Seated Long kicks and knee flexion x 10 reps PROM into knee flexion Educated patient to perform self ROM for flex R knee flexion 100 degrees Vaso-15 min, 34, low pressure, R knee  10/22/21 Bike 66mns partial range  PROM flex/ext can get past 90d  Gastroc stretch  Hamstring curls red 2x10 Hamstring stretch 30s SLR 2# 2x10 LAQ 2# 2x10  Vaso [id number],  med  10/17/21 MT R knee PROM gentle   Quad sets x10  SAQ x10   NuStep L 2 x 6 min   LAQ R 2lb 2x10   Hamstring curls yellow 2x10   S2S 2x4 no UE   Seated march   GameReady low 34deg x10 min  10/15/21  Quad sets 1x15 3 second holds  SLRs 1x15 R LE Hamstring slides 1x15 SAQs 1x15 3 second holds   LAQs 1x15 3 second holds  Self-overpressure for knee flexion10x10 holds   Bike seat 13 partial rotations x6 minutes   Ice at EOS (with ongoing education for pain management)      PATIENT EDUCATION:  Education details: pain and muscle spasm management at home, HEP updates Person educated: Patient and Spouse Education method: ExCustomer service managerducation comprehension: verbalized understanding and returned demonstration   HOME EXERCISE PROGRAM: [email or web address]  ASSESSMENT:  CLINICAL IMPRESSION: Patient reports no new pain, swelling continues. Performed strengthening and ROM for R knee, STM and finished with Vaso for swelling.    OBJECTIVE IMPAIRMENTS Abnormal gait, decreased activity tolerance, difficulty walking, decreased ROM, decreased strength, and obesity.   ACTIVITY LIMITATIONS carrying, lifting, bending, sitting, standing, squatting, stairs, and locomotion level  PARTICIPATION LIMITATIONS: cleaning, laundry, driving, shopping, community activity, and walking, household chores  PERSONAL FACTORS Age, Past/current experiences, Sex, and 1-2 comorbidities: HTN, prediabetes, hyperlipidemia  are also affecting patient's functional outcome.   REHAB POTENTIAL: Good  CLINICAL DECISION MAKING: Stable/uncomplicated  EVALUATION COMPLEXITY: Low   GOALS: Goals reviewed with patient? Yes  SHORT TERM GOALS: Target date: 11/23/21 Patient will be independent with initial HEP. Goal status: met   LONG TERM GOALS: Target date: 12/21/21  1.  Patient will report at least 75% improvement in R knee pain to improve QOL. Goal status: ongoing  2.  Patient will demonstrate  improved R knee AROM to >/= 0-120 deg to allow for normal gait and stair mechanics. Goal status: ongoing  3.  Patient will demonstrate improved functional LE strength as demonstrated by >= 4/5 to be able to walk without pain. Goal status: ongoing  4.  Patient will be able to ambulate 600' without assistive device and normal gait pattern without increased pain to access community.  Goal status: INITIAL  5. Patient will be able to ascend/descend stairs with 1 HR and reciprocal step pattern safely to access home and community.  Goal status: INITIAL  7.  Patient will report 7073n FOTO (patient reported outcome measure) to demonstrate improved functional ability. Goal status: INITIAL     PLAN: PT FREQUENCY: 2x/week  PT DURATION: 10 weeks  PLANNED INTERVENTIONS: Therapeutic exercises, Therapeutic activity, Neuromuscular re-education, Balance training, Gait training, Patient/Family education, Joint mobilization, Stair training, Electrical stimulation, Cryotherapy, Moist heat, Vasopneumatic device, Ionotophoresis 81m86ml Dexamethasone, and Manual therapy  PLAN FOR NEXT SESSION: Review HEP, AAROM for R knee, gait training, Standing exercises (hip abd, marches, calf raises,  heel taps)   Ethel Rana DPT 10/31/21 12:22 PM  10/31/2021, 12:22 PM

## 2021-11-05 ENCOUNTER — Ambulatory Visit: Payer: Medicare Other

## 2021-11-07 ENCOUNTER — Ambulatory Visit (HOSPITAL_BASED_OUTPATIENT_CLINIC_OR_DEPARTMENT_OTHER): Payer: Medicare Other | Admitting: Obstetrics & Gynecology

## 2021-11-08 ENCOUNTER — Ambulatory Visit: Payer: Medicare Other | Admitting: Physical Therapy

## 2021-11-13 ENCOUNTER — Ambulatory Visit: Payer: Medicare Other

## 2021-11-16 ENCOUNTER — Ambulatory Visit: Payer: Medicare Other

## 2021-11-20 ENCOUNTER — Ambulatory Visit: Payer: Medicare Other | Admitting: Physical Therapy

## 2021-11-23 ENCOUNTER — Ambulatory Visit: Payer: Medicare Other

## 2021-11-27 ENCOUNTER — Ambulatory Visit: Payer: Medicare Other

## 2021-11-29 ENCOUNTER — Ambulatory Visit: Payer: Medicare Other | Admitting: Physical Therapy

## 2021-12-24 ENCOUNTER — Other Ambulatory Visit (HOSPITAL_COMMUNITY)
Admission: RE | Admit: 2021-12-24 | Discharge: 2021-12-24 | Disposition: A | Discharge status: Home / Self Care | Payer: Medicare Other | Source: Ambulatory Visit | Attending: Obstetrics & Gynecology | Admitting: Obstetrics & Gynecology

## 2021-12-24 ENCOUNTER — Ambulatory Visit (INDEPENDENT_AMBULATORY_CARE_PROVIDER_SITE_OTHER): Payer: Medicare Other | Admitting: Obstetrics & Gynecology

## 2021-12-24 ENCOUNTER — Encounter (HOSPITAL_BASED_OUTPATIENT_CLINIC_OR_DEPARTMENT_OTHER): Payer: Self-pay | Admitting: Obstetrics & Gynecology

## 2021-12-24 VITALS — BP 163/67 | HR 67 | Ht 64.0 in | Wt 220.8 lb

## 2021-12-24 DIAGNOSIS — N898 Other specified noninflammatory disorders of vagina: Secondary | ICD-10-CM | POA: Insufficient documentation

## 2021-12-24 DIAGNOSIS — N949 Unspecified condition associated with female genital organs and menstrual cycle: Secondary | ICD-10-CM

## 2021-12-24 DIAGNOSIS — Z6837 Body mass index (BMI) 37.0-37.9, adult: Secondary | ICD-10-CM

## 2021-12-24 DIAGNOSIS — E2839 Other primary ovarian failure: Secondary | ICD-10-CM | POA: Diagnosis not present

## 2021-12-24 DIAGNOSIS — Z1231 Encounter for screening mammogram for malignant neoplasm of breast: Secondary | ICD-10-CM | POA: Diagnosis not present

## 2021-12-24 DIAGNOSIS — Z124 Encounter for screening for malignant neoplasm of cervix: Secondary | ICD-10-CM | POA: Insufficient documentation

## 2021-12-24 DIAGNOSIS — Z8619 Personal history of other infectious and parasitic diseases: Secondary | ICD-10-CM

## 2021-12-24 NOTE — Progress Notes (Unsigned)
66 y.o. [id number] Married White or Caucasian female here for new patient exam.  Pt was former patient.  Moved back from [place] [employer] [place] due to having a new grandchild here locally.  Last time I saw patient was in 2019.      Had some left abdominal pain.  Saw PCP.  CT was ordered.  Possible liver cysts seen, non obstructing stones seen, adnexal cyst noted, 66mm lung nodule was seen and she is having a chest CT.  Does have liver ultrasound planned.    Has recently start Ozempic.   Denies vaginal bleeding.    Patient's last menstrual period was 05/06/2002.          Sexually active: No.  The current method of family planning is post menopausal status.    Exercising: Yes.     Smoker:  no  Health Maintenance: Pap:  2019 History of abnormal Pap:  no MMG:  01/2020 Colonoscopy:  done in [place].  Had a rectal polyp.  02/10/2018.  Benign rectal polyp removed 06/2018.  No additional treatment needed.  BMD:   never, will order Screening Labs: done with PCP   reports that she has never smoked. She has never used smokeless tobacco. She reports current alcohol use of about 5.0 - 6.0 standard drinks of alcohol per week. She reports that she does not use drugs.  Past Medical History:  Diagnosis Date   Anemia    Anxiety    Arthritis    Headache    Hyperlipemia    Hypertension    Pre-diabetes    Scoliosis     Past Surgical History:  Procedure Laterality Date   BREAST BIOPSY     CESAREAN SECTION     x3   CHOLECYSTECTOMY, LAPAROSCOPIC     ESOPHAGOGASTRODUODENOSCOPY     HEMORRHOID SURGERY     PARTIAL KNEE ARTHROPLASTY Right 10/09/2021   Procedure: UNICOMPARTMENTAL KNEE;  Surgeon: Teryl Lucy, MD;  Location: [facility];  Service: Orthopedics;  Laterality: Right;   TOTAL KNEE ARTHROPLASTY Left 8/14        Current Outpatient Medications  Medication Sig Dispense Refill   amLODipine (NORVASC) 10 MG tablet Take 10 mg by mouth daily.     aspirin EC 325 MG tablet Take 1 tablet (325 mg total) by mouth 2 (two)  times daily. 60 tablet 0   atorvastatin (LIPITOR) 80 MG tablet Take 80 mg by mouth daily.     baclofen (LIORESAL) 10 MG tablet Take 1 tablet (10 mg total) by mouth 3 (three) times daily. As needed for muscle spasm 30 tablet 0   clobetasol cream (TEMOVATE) 0.05 % Apply 1 application. topically every 3 (three) days.     DULoxetine (CYMBALTA) 30 MG capsule Take 30 mg by mouth daily.     ferrous sulfate 325 (65 FE) MG tablet Take 325 mg by mouth daily with breakfast.     losartan (COZAAR) 50 MG tablet Take 50 mg by mouth 2 (two) times daily.     magnesium gluconate (MAGONATE) 500 MG tablet Take 500 mg by mouth 2 (two) times daily.     Multiple Vitamins-Minerals (MULTIVITAMIN PO) Take 1 tablet by mouth daily.     ondansetron (ZOFRAN) 4 MG tablet Take 1 tablet (4 mg total) by mouth every 8 (eight) hours as needed for nausea or vomiting. 10 tablet 0   oxyCODONE-acetaminophen (PERCOCET) 5-325 MG tablet Take 1-2 tablets by mouth every 4 (four) hours as needed for severe pain. 40 tablet 0   Probiotic Product (PROBIOTIC  DAILY PO) Take 1 capsule by mouth daily.     sennosides-docusate sodium (SENOKOT-S) 8.6-50 MG tablet Take 2 tablets by mouth daily. 30 tablet 1   spironolactone (ALDACTONE) 25 MG tablet Take 25 mg by mouth daily.     Turmeric 500 MG CAPS Take 500 mg by mouth daily.     valACYclovir (VALTREX) 1000 MG tablet Take 1,000 mg by mouth daily as needed (cold sores).     ALPRAZolam (XANAX) 0.25 MG tablet Take 0.25 mg by mouth daily as needed for anxiety. (Patient not taking: Reported on 12/24/2021)     Misc Natural Products (GLUCOSAMINE CHOND MSM FORMULA PO) Take by mouth. (Patient not taking: Reported on 12/24/2021)     No current facility-administered medications for this visit.    Family History  Problem Relation Age of Onset   Diabetes Father    Hypertension Father    Heart disease Father    Hypertension Mother     ROS: Constitutional: negative Genitourinary:negative  Exam:   BP (!)  163/67   Pulse 67   Ht 5\' 4"  (1.626 m)   Wt 220 lb 12.8 oz (100.2 kg)   LMP 05/06/2002   BMI 37.90 kg/m   Height: 5\' 4"  (162.6 cm)  General appearance: alert, cooperative and appears stated age Head: Normocephalic, without obvious abnormality, atraumatic Neck: no adenopathy, supple, symmetrical, trachea midline and thyroid normal to inspection and palpation Lungs: clear to auscultation bilaterally Breasts: {Exam; breast:13139::"normal appearance, no masses or tenderness"} Heart: regular rate and rhythm Abdomen: soft, non-tender; bowel sounds normal; no masses,  no organomegaly Extremities: extremities normal, atraumatic, no cyanosis or edema Skin: Skin color, texture, turgor normal. No rashes or lesions Lymph nodes: Cervical, supraclavicular, and axillary nodes normal. No abnormal inguinal nodes palpated Neurologic: Grossly normal   Pelvic: External genitalia:  no lesions              Urethra:  normal appearing urethra with no masses, tenderness or lesions              Bartholins and Skenes: normal                 Vagina: normal appearing vagina with normal color and no discharge, no lesions              Cervix: {exam; cervix:14595}              Pap taken: {yes no:314532} Bimanual Exam:  Uterus:  {exam; uterus:12215}              Adnexa: {exam; adnexa:12223}               Rectovaginal: Confirms               Anus:  normal sphincter tone, no lesions  Chaperone, ***, CMA, was present for exam.  Assessment/Plan:

## 2021-12-25 LAB — CERVICOVAGINAL ANCILLARY ONLY
Bacterial Vaginitis (gardnerella): NEGATIVE
Candida Glabrata: NEGATIVE
Candida Vaginitis: NEGATIVE
Comment: NEGATIVE
Comment: NEGATIVE
Comment: NEGATIVE

## 2021-12-31 LAB — CYTOLOGY - PAP: Diagnosis: NEGATIVE

## 2022-02-08 ENCOUNTER — Inpatient Hospital Stay (HOSPITAL_BASED_OUTPATIENT_CLINIC_OR_DEPARTMENT_OTHER): Admission: RE | Admit: 2022-02-08 | Payer: Medicare Other | Source: Ambulatory Visit | Admitting: Radiology

## 2022-03-08 ENCOUNTER — Ambulatory Visit (HOSPITAL_BASED_OUTPATIENT_CLINIC_OR_DEPARTMENT_OTHER): Payer: Medicare Other | Admitting: Radiology

## 2022-03-25 ENCOUNTER — Encounter (HOSPITAL_BASED_OUTPATIENT_CLINIC_OR_DEPARTMENT_OTHER): Payer: Self-pay | Admitting: Obstetrics & Gynecology

## 2022-04-26 ENCOUNTER — Telehealth: Payer: Self-pay | Admitting: Podiatry

## 2022-04-26 ENCOUNTER — Ambulatory Visit (INDEPENDENT_AMBULATORY_CARE_PROVIDER_SITE_OTHER): Payer: Medicare Other | Admitting: Podiatry

## 2022-04-26 DIAGNOSIS — M76821 Posterior tibial tendinitis, right leg: Secondary | ICD-10-CM | POA: Diagnosis not present

## 2022-04-26 NOTE — Telephone Encounter (Signed)
Patient called she wants to know if she needs to continue going to PT , please advise.

## 2022-04-26 NOTE — Progress Notes (Signed)
Subjective:  Patient ID: [name], female    DOB: 06/20/1955,  MRN: [id number]  Chief Complaint  Patient presents with   Foot Pain    Right foot pain pt stated that she has been going to physical therapy but it is not improving and she wanted to have someone look at it     66 y.o. female presents with the above complaint.  Patient presents with right posterior tibial tendinitis patient states that this has been going for quite some time.  She has been doing physical therapy she has done immobilization none of which has helped.  She wanted to have someone look at it.  She states the pain scale 7 out of 10 hurts with ambulation hurts with pressure.  She had an MRI done which shows possible longitudinal tearing.  She does not have the MRI or the report with her.  She has been doing multiple conservative treatment options   Review of Systems: Negative except as noted in the HPI. Denies N/V/F/Ch.  Past Medical History:  Diagnosis Date   Anemia    Anxiety    Arthritis    Headache    Hyperlipemia    Hypertension    Pre-diabetes    Scoliosis     Current Outpatient Medications:    amLODipine (NORVASC) 10 MG tablet, Take 10 mg by mouth daily., Disp: , Rfl:    aspirin EC 325 MG tablet, Take 1 tablet (325 mg total) by mouth 2 (two) times daily., Disp: 60 tablet, Rfl: 0   atorvastatin (LIPITOR) 80 MG tablet, Take 80 mg by mouth daily., Disp: , Rfl:    baclofen (LIORESAL) 10 MG tablet, Take 1 tablet (10 mg total) by mouth 3 (three) times daily. As needed for muscle spasm, Disp: 30 tablet, Rfl: 0   clobetasol cream (TEMOVATE) 0.05 %, Apply 1 application. topically every 3 (three) days., Disp: , Rfl:    DULoxetine (CYMBALTA) 30 MG capsule, Take 30 mg by mouth daily., Disp: , Rfl:    ferrous sulfate 325 (65 FE) MG tablet, Take 325 mg by mouth daily with breakfast., Disp: , Rfl:    losartan (COZAAR) 50 MG tablet, Take 50 mg by mouth 2 (two) times daily., Disp: , Rfl:    magnesium gluconate  (MAGONATE) 500 MG tablet, Take 500 mg by mouth 2 (two) times daily., Disp: , Rfl:    Multiple Vitamins-Minerals (MULTIVITAMIN PO), Take 1 tablet by mouth daily., Disp: , Rfl:    ondansetron (ZOFRAN) 4 MG tablet, Take 1 tablet (4 mg total) by mouth every 8 (eight) hours as needed for nausea or vomiting., Disp: 10 tablet, Rfl: 0   oxyCODONE-acetaminophen (PERCOCET) 5-325 MG tablet, Take 1-2 tablets by mouth every 4 (four) hours as needed for severe pain., Disp: 40 tablet, Rfl: 0   Probiotic Product (PROBIOTIC DAILY PO), Take 1 capsule by mouth daily., Disp: , Rfl:    sennosides-docusate sodium (SENOKOT-S) 8.6-50 MG tablet, Take 2 tablets by mouth daily., Disp: 30 tablet, Rfl: 1   spironolactone (ALDACTONE) 25 MG tablet, Take 25 mg by mouth daily., Disp: , Rfl:    Turmeric 500 MG CAPS, Take 500 mg by mouth daily., Disp: , Rfl:    valACYclovir (VALTREX) 1000 MG tablet, Take 1,000 mg by mouth daily as needed (cold sores)., Disp: , Rfl:   Social History   Tobacco Use  Smoking Status Never  Smokeless Tobacco Never    Allergies  Allergen Reactions   Ace Inhibitors Cough  Crestor [Rosuvastatin] Other (See Comments)    Leg cramps   Latex     rash   Penicillins     Vaginal Infection    Objective:  There were no vitals filed for this visit. There is no height or weight on file to calculate BMI. Constitutional Well developed. Well nourished.  Vascular Dorsalis pedis pulses palpable bilaterally. Posterior tibial pulses palpable bilaterally. Capillary refill normal to all digits.  No cyanosis or clubbing noted. Pedal hair growth normal.  Neurologic Normal speech. Oriented to person, place, and time. Epicritic sensation to light touch grossly present bilaterally.  Dermatologic Nails well groomed and normal in appearance. No open wounds. No skin lesions.  Orthopedic: Pain on palpation to the right posterior tibial tendon pain along the course of the posterior tibial tendon some pain  at the insertion.  Pain with resisted plantarflexion inversion of the foot.  No pain with dorsiflexion eversion of the foot.  No pain at the Achilles tendon peroneal tendon ATFL ligament   Radiographs: None Assessment:   1. Posterior tibial tendinitis, right    Plan:  Patient was evaluated and treated and all questions answered.  Right posterior tibial tendinitis with a possible longitudinal tear -All questions and concerns were discussed with the patient in extensive detail.  She has been conservatively treated by Dr. Dion Saucier.  -At this time I have asked her to bring her MRI and to report so it can be uploaded to the [facility] health [facility]. -We will plan on doing ankle/foot x-rays at the next clinical visit -Will discuss surgical options if there is a complex tear -She is a diabetic.  No follow-ups on file.    Right posterior tibial tendinitis with a possible longitudinal tear.  Conservatively treated by Dr.Landau.  She will bring me the MRI and report during next visit for surgical consult

## 2022-04-26 NOTE — Telephone Encounter (Signed)
Lmovm that if pt feels that PT is helping to go ahead and continue it, if she feels it is not helping to discontinue

## 2022-05-15 ENCOUNTER — Other Ambulatory Visit (HOSPITAL_BASED_OUTPATIENT_CLINIC_OR_DEPARTMENT_OTHER): Payer: Medicare Other

## 2022-05-15 ENCOUNTER — Ambulatory Visit (HOSPITAL_BASED_OUTPATIENT_CLINIC_OR_DEPARTMENT_OTHER): Payer: Medicare Other | Admitting: Obstetrics & Gynecology

## 2022-05-21 ENCOUNTER — Ambulatory Visit (INDEPENDENT_AMBULATORY_CARE_PROVIDER_SITE_OTHER): Payer: Medicare Other

## 2022-05-21 ENCOUNTER — Telehealth (HOSPITAL_BASED_OUTPATIENT_CLINIC_OR_DEPARTMENT_OTHER): Payer: Self-pay | Admitting: *Deleted

## 2022-05-21 ENCOUNTER — Ambulatory Visit (INDEPENDENT_AMBULATORY_CARE_PROVIDER_SITE_OTHER): Payer: Medicare Other | Admitting: Podiatry

## 2022-05-21 VITALS — BP 128/68

## 2022-05-21 DIAGNOSIS — Z01818 Encounter for other preprocedural examination: Secondary | ICD-10-CM

## 2022-05-21 DIAGNOSIS — M76821 Posterior tibial tendinitis, right leg: Secondary | ICD-10-CM

## 2022-05-21 NOTE — Telephone Encounter (Signed)
LMOVM for pt to call regarding mammogram

## 2022-05-21 NOTE — Progress Notes (Signed)
Subjective:  Patient ID: [name], female    DOB: 1955/08/23,  MRN: [id number]  Chief Complaint  Patient presents with   Foot Pain    Follow up on right foot     67 y.o. female presents with the above complaint.  Patient presents with right posterior tibial tendinitis patient states that this has been going for quite some time.  She has been doing physical therapy she has done immobilization none of which has helped.  She wanted to have someone look at it.  She states the pain scale 7 out of 10 hurts with ambulation hurts with pressure.  She had an MRI done which shows possible longitudinal tearing.  She does not have the MRI or the report with her.  She has been doing multiple conservative treatment options   Review of Systems: Negative except as noted in the HPI. Denies N/V/F/Ch.  Past Medical History:  Diagnosis Date   Anemia    Anxiety    Arthritis    Headache    Hyperlipemia    Hypertension    Pre-diabetes    Scoliosis     Current Outpatient Medications:    amLODipine (NORVASC) 10 MG tablet, Take 10 mg by mouth daily., Disp: , Rfl:    aspirin EC 325 MG tablet, Take 1 tablet (325 mg total) by mouth 2 (two) times daily., Disp: 60 tablet, Rfl: 0   atorvastatin (LIPITOR) 80 MG tablet, Take 80 mg by mouth daily., Disp: , Rfl:    baclofen (LIORESAL) 10 MG tablet, Take 1 tablet (10 mg total) by mouth 3 (three) times daily. As needed for muscle spasm, Disp: 30 tablet, Rfl: 0   clobetasol cream (TEMOVATE) 2.95 %, Apply 1 application. topically every 3 (three) days., Disp: , Rfl:    DULoxetine (CYMBALTA) 30 MG capsule, Take 30 mg by mouth daily., Disp: , Rfl:    ferrous sulfate 325 (65 FE) MG tablet, Take 325 mg by mouth daily with breakfast., Disp: , Rfl:    losartan (COZAAR) 50 MG tablet, Take 50 mg by mouth 2 (two) times daily., Disp: , Rfl:    magnesium gluconate (MAGONATE) 500 MG tablet, Take 500 mg by mouth 2 (two) times daily., Disp: , Rfl:    Multiple  Vitamins-Minerals (MULTIVITAMIN PO), Take 1 tablet by mouth daily., Disp: , Rfl:    ondansetron (ZOFRAN) 4 MG tablet, Take 1 tablet (4 mg total) by mouth every 8 (eight) hours as needed for nausea or vomiting., Disp: 10 tablet, Rfl: 0   oxyCODONE-acetaminophen (PERCOCET) 5-325 MG tablet, Take 1-2 tablets by mouth every 4 (four) hours as needed for severe pain., Disp: 40 tablet, Rfl: 0   Probiotic Product (PROBIOTIC DAILY PO), Take 1 capsule by mouth daily., Disp: , Rfl:    sennosides-docusate sodium (SENOKOT-S) 8.6-50 MG tablet, Take 2 tablets by mouth daily., Disp: 30 tablet, Rfl: 1   spironolactone (ALDACTONE) 25 MG tablet, Take 25 mg by mouth daily., Disp: , Rfl:    Turmeric 500 MG CAPS, Take 500 mg by mouth daily., Disp: , Rfl:    valACYclovir (VALTREX) 1000 MG tablet, Take 1,000 mg by mouth daily as needed (cold sores)., Disp: , Rfl:   Social History   Tobacco Use  Smoking Status Never  Smokeless Tobacco Never    Allergies  Allergen Reactions   Ace Inhibitors Cough        Crestor [Rosuvastatin] Other (See Comments)    Leg cramps   Latex  rash   Penicillins     Vaginal Infection    Objective:   Vitals:   05/21/22 1306  BP: 128/68   There is no height or weight on file to calculate BMI. Constitutional Well developed. Well nourished.  Vascular Dorsalis pedis pulses palpable bilaterally. Posterior tibial pulses palpable bilaterally. Capillary refill normal to all digits.  No cyanosis or clubbing noted. Pedal hair growth normal.  Neurologic Normal speech. Oriented to person, place, and time. Epicritic sensation to light touch grossly present bilaterally.  Dermatologic Nails well groomed and normal in appearance. No open wounds. No skin lesions.  Orthopedic: Pain on palpation to the right posterior tibial tendon pain along the course of the posterior tibial tendon some pain at the insertion.  Pain with resisted plantarflexion inversion of the foot.  No pain with  dorsiflexion eversion of the foot.  No pain at the Achilles tendon peroneal tendon ATFL ligament   Radiographs: IMPRESSION: 1. Moderate tendinosis of the posterior tibial tendon with a complex tear. 2. Mild tendinosis of the peroneus longus. 3. Mild osteoarthritis of the posterior subtalar joint. 4. Mild osteoarthritis of the talonavicular joint. 5. Subcortical extra-articular reactive marrow changes in the lateral talus and lateral calcaneus as can be seen with posterolateral ankle impingement. 6. Mild osteoarthritis of the navicular-medial cuneiform joint.  Pes planovalgus foot type noted.  3 views of skeletally mature adult: No navicular exostosis noted.  Assessment:   1. Posterior tibial tendinitis, right    Plan:  Patient was evaluated and treated and all questions answered.  Right posterior tibial tendinitis with a possible longitudinal tear -All questions and concerns were discussed with the patient in extensive detail.  She has been conservatively treated by Dr. Mardelle Matte.  -Given the MRI findings of longitudinal tearing with complex tear noted to the posterior tibial tendon patient will benefit from surgical repair of the tear with a possible FDL transfer.  I discussed my preoperative intra postoperative plan in extensive detail she states understanding.  I discussed with the patient she states understanding.  She would like to proceed with surgery.  I discussed my preoperative intraoperative postop plan in extensive detail she states understanding to proceed with surgery. -She will need to be nonweightbearing for 4 to 6 weeks.  She states understand will do so -Informed surgical risk consent was reviewed and read aloud to the patient.  I reviewed the films.  I have discussed my findings with the patient in great detail.  I have discussed all risks including but not limited to infection, stiffness, scarring, limp, disability, deformity, damage to blood vessels and nerves, numbness,  poor healing, need for braces, arthritis, chronic pain, amputation, death.  All benefits and realistic expectations discussed in great detail.  I have made no promises as to the outcome.  I have provided realistic expectations.  I have offered the patient a 2nd opinion, which they have declined and assured me they preferred to proceed despite the risks   No follow-ups on file.

## 2022-05-29 ENCOUNTER — Telehealth (HOSPITAL_BASED_OUTPATIENT_CLINIC_OR_DEPARTMENT_OTHER): Payer: Self-pay | Admitting: *Deleted

## 2022-05-29 NOTE — Telephone Encounter (Signed)
-----  Message from Megan Salon, MD sent at 05/21/2022 10:45 AM EST ----- Regarding: mammogram Maudie Mercury and Kenney Houseman, Would either of you call this pt and see if she's gone for her mammogram.  She hadn't had one since 2019.  I ordered it at [facility] but we can do it at [facility] as well if that is convenient.  Thanks.  MSM

## 2022-05-29 NOTE — Telephone Encounter (Signed)
LMOVM for pt to call office regarding mammogram

## 2022-05-30 ENCOUNTER — Telehealth (HOSPITAL_BASED_OUTPATIENT_CLINIC_OR_DEPARTMENT_OTHER): Payer: Self-pay | Admitting: *Deleted

## 2022-05-30 NOTE — Telephone Encounter (Signed)
Called pt to follow up on mammogram. Pt has not had it done. She had a knee replacement last June and attempted to schedule it some time after that but the order had expired. She would like a new order placed and will try to get it done before she has ankle surgery at the end of February. Pt does not wish to proceed with bone density test at this time.

## 2022-06-04 ENCOUNTER — Telehealth: Payer: Self-pay | Admitting: *Deleted

## 2022-06-04 NOTE — Telephone Encounter (Signed)
Patient is requesting a temporary handicap placard,please advise.

## 2022-06-05 NOTE — Telephone Encounter (Signed)
Handicap placard has been approved and waiting for patient pick-up,patient updated thru voice message,will pick up from [facility] location.

## 2022-06-06 ENCOUNTER — Telehealth: Payer: Self-pay | Admitting: Podiatry

## 2022-06-06 NOTE — Telephone Encounter (Signed)
DOS: 07/01/2022  Medicare Part A & B Academic librarian [place] Rt [phone])  Prior authorization is not required for either of patients insurances.

## 2022-06-10 ENCOUNTER — Telehealth: Payer: Self-pay

## 2022-06-11 MED ORDER — ALPRAZOLAM 0.5 MG PO TABS: 0.5000 mg | ORAL_TABLET | Freq: Two times a day (BID) | ORAL | 0 refills | Status: DC | PRN

## 2022-06-18 NOTE — Telephone Encounter (Signed)
No additional notes needed  

## 2022-06-24 ENCOUNTER — Encounter (HOSPITAL_BASED_OUTPATIENT_CLINIC_OR_DEPARTMENT_OTHER): Payer: Self-pay | Admitting: *Deleted

## 2022-07-01 ENCOUNTER — Encounter: Payer: Self-pay | Admitting: Podiatry

## 2022-07-01 ENCOUNTER — Other Ambulatory Visit: Payer: Self-pay | Admitting: Podiatry

## 2022-07-01 DIAGNOSIS — M25774 Osteophyte, right foot: Secondary | ICD-10-CM

## 2022-07-01 DIAGNOSIS — M76821 Posterior tibial tendinitis, right leg: Secondary | ICD-10-CM

## 2022-07-01 MED ORDER — OXYCODONE-ACETAMINOPHEN 5-325 MG PO TABS: 1.0000 | ORAL_TABLET | ORAL | 0 refills | Status: DC | PRN

## 2022-07-01 MED ORDER — IBUPROFEN 800 MG PO TABS: 800.0000 mg | ORAL_TABLET | Freq: Four times a day (QID) | ORAL | 1 refills | Status: DC | PRN

## 2022-07-02 ENCOUNTER — Telehealth: Payer: Self-pay | Admitting: *Deleted

## 2022-07-02 NOTE — Telephone Encounter (Signed)
Patient is asking if her son who is a nurse re wrap her dressing,is saturated/wet, even though the bleeding has stopped, wrappings are wet. Please advise.

## 2022-07-02 NOTE — Telephone Encounter (Signed)
Betsy with [facility] is wanting to let the physician know that the patient has requested that her start date be moved to tomorrow, any questions may call.

## 2022-07-02 NOTE — Telephone Encounter (Signed)
Patient is calling for a pain medication for hydrocodone-ace, explained that oxycodone had been sent to pharmacy one day ago, verbalized understanding , was unaware.

## 2022-07-03 NOTE — Telephone Encounter (Signed)
Patient has been updated and said that now it's just dried blood but will let her son re wrap if needed before upcoming appointment.

## 2022-07-09 ENCOUNTER — Ambulatory Visit (INDEPENDENT_AMBULATORY_CARE_PROVIDER_SITE_OTHER): Payer: Medicare Other | Admitting: Podiatry

## 2022-07-09 ENCOUNTER — Ambulatory Visit (INDEPENDENT_AMBULATORY_CARE_PROVIDER_SITE_OTHER): Payer: Medicare Other

## 2022-07-09 DIAGNOSIS — M76821 Posterior tibial tendinitis, right leg: Secondary | ICD-10-CM

## 2022-07-09 DIAGNOSIS — Z9889 Other specified postprocedural states: Secondary | ICD-10-CM

## 2022-07-09 MED ORDER — DOXYCYCLINE HYCLATE 100 MG PO TABS: 100.0000 mg | ORAL_TABLET | Freq: Two times a day (BID) | ORAL | 0 refills | Status: AC

## 2022-07-09 NOTE — Progress Notes (Signed)
Subjective:  Patient ID: [name], female    DOB: 09-Dec-1955,  MRN: [id number]  Chief Complaint  Patient presents with   Routine Post Op    POV #1 DOS 07/01/22 -- RIGHT POST TIBIAL TENDON REPAIR WITH POSSIBLE FLEX TENDON TRANSFER WITH USE OF POSSIBLE ANCHOR    DOS: 07/01/2022 Procedure: Right posterior tibial tendon repair with Kidner advancement  67 y.o. female returns for post-op check.  Patient states she is doing well minimal complaints.  No acute complaints.  Nonweightbearing to the right side.  Review of Systems: Negative except as noted in the HPI. Denies N/V/F/Ch.  Past Medical History:  Diagnosis Date   Anemia    Anxiety    Arthritis    Headache    Hyperlipemia    Hypertension    Pre-diabetes    Scoliosis     Current Outpatient Medications:    ALPRAZolam (XANAX) 0.5 MG tablet, Take 1 tablet (0.5 mg total) by mouth 2 (two) times daily as needed for anxiety., Disp: 20 tablet, Rfl: 0   amLODipine (NORVASC) 10 MG tablet, Take 10 mg by mouth daily., Disp: , Rfl:    aspirin EC 325 MG tablet, Take 1 tablet (325 mg total) by mouth 2 (two) times daily., Disp: 60 tablet, Rfl: 0   atorvastatin (LIPITOR) 80 MG tablet, Take 80 mg by mouth daily., Disp: , Rfl:    baclofen (LIORESAL) 10 MG tablet, Take 1 tablet (10 mg total) by mouth 3 (three) times daily. As needed for muscle spasm, Disp: 30 tablet, Rfl: 0   clobetasol cream (TEMOVATE) [id number] %, Apply 1 application. topically every 3 (three) days., Disp: , Rfl:    DULoxetine (CYMBALTA) 30 MG capsule, Take 30 mg by mouth daily., Disp: , Rfl:    ferrous sulfate 325 (65 FE) MG tablet, Take 325 mg by mouth daily with breakfast., Disp: , Rfl:    ibuprofen (ADVIL) 800 MG tablet, Take 1 tablet (800 mg total) by mouth every 6 (six) hours as needed., Disp: 60 tablet, Rfl: 1   losartan (COZAAR) 50 MG tablet, Take 50 mg by mouth 2 (two) times daily., Disp: , Rfl:    magnesium gluconate (MAGONATE) 500 MG tablet, Take 500 mg by mouth 2  (two) times daily., Disp: , Rfl:    Multiple Vitamins-Minerals (MULTIVITAMIN PO), Take 1 tablet by mouth daily., Disp: , Rfl:    ondansetron (ZOFRAN) 4 MG tablet, Take 1 tablet (4 mg total) by mouth every 8 (eight) hours as needed for nausea or vomiting., Disp: 10 tablet, Rfl: 0   oxyCODONE-acetaminophen (PERCOCET) 5-325 MG tablet, Take 1-2 tablets by mouth every 4 (four) hours as needed for severe pain., Disp: 40 tablet, Rfl: 0   oxyCODONE-acetaminophen (PERCOCET) 5-325 MG tablet, Take 1-2 tablets by mouth every 4 (four) hours as needed for severe pain., Disp: 30 tablet, Rfl: 0   Probiotic Product (PROBIOTIC DAILY PO), Take 1 capsule by mouth daily., Disp: , Rfl:    sennosides-docusate sodium (SENOKOT-S) 8.6-50 MG tablet, Take 2 tablets by mouth daily., Disp: 30 tablet, Rfl: 1   spironolactone (ALDACTONE) 25 MG tablet, Take 25 mg by mouth daily., Disp: , Rfl:    Turmeric 500 MG CAPS, Take 500 mg by mouth daily., Disp: , Rfl:    valACYclovir (VALTREX) 1000 MG tablet, Take 1,000 mg by mouth daily as needed (cold sores)., Disp: , Rfl:   Social History   Tobacco Use  Smoking Status Never  Smokeless Tobacco Never    Allergies  Allergen  Reactions   Ace Inhibitors Cough        Crestor [Rosuvastatin] Other (See Comments)    Leg cramps   Latex     rash   Penicillins     Vaginal Infection    Objective:  There were no vitals filed for this visit. There is no height or weight on file to calculate BMI. Constitutional Well developed. Well nourished.  Vascular Foot warm and well perfused. Capillary refill normal to all digits.   Neurologic Normal speech. Oriented to person, place, and time. Epicritic sensation to light touch grossly present bilaterally.  Dermatologic Skin healing well without signs of infection. Skin edges well coapted without signs of infection.  Orthopedic: Tenderness to palpation noted about the surgical site.   Radiographs: 3 discussion with Ortho right foot: Good  alignment noted.  Anchor is within normal limits.  Good correction noted.  Exostectomy of the navicular bone on the Assessment:   1. Posterior tibial tendinitis, right   2. S/P foot surgery    Plan:  Patient was evaluated and treated and all questions answered.  S/p foot surgery right -Progressing as expected post-operatively. -XR: With normal limits -WB Status: Nonweightbearing to the right lower extremity in wheelchair -Sutures: Intact.  No medical signs of Deis is noted no complication noted -Medications: None -Foot redressed.  No follow-ups on file.

## 2022-07-10 ENCOUNTER — Telehealth: Payer: Self-pay | Admitting: *Deleted

## 2022-07-10 NOTE — Telephone Encounter (Signed)
Patient is calling to ask if she is supposed to keep her sutures in for a month, next appointment is scheduled for 08/06/22,will they be ok in for that length of time?Please advise.

## 2022-07-11 ENCOUNTER — Telehealth (HOSPITAL_BASED_OUTPATIENT_CLINIC_OR_DEPARTMENT_OTHER): Payer: Self-pay | Admitting: *Deleted

## 2022-07-11 NOTE — Telephone Encounter (Signed)
Called pt to discuss scheduling follow up ultrasound on ovarian/adnexal cyst. Pt states that she is currently in cast and can not do that at this time. She states that her GP is handling her follow up on the cyst. Advised that I would let Dr. Sabra Heck know.

## 2022-07-11 NOTE — Telephone Encounter (Signed)
-----   Message from Megan Salon, MD sent at 07/10/2022  4:07 AM EST ----- Regarding: ultrasound f/u Maudie Mercury, Can you call Ms. Mccartt.  She needs ultrasound follow up for an ovarian/adnexal cyst.  Had foot surgery so not sure when she can do this.  Order has been placed.  Also, we did get her mammogram.  Can you thank her for having it done?  Thank you.  MSM

## 2022-07-12 NOTE — Telephone Encounter (Signed)
Patient has been updated per physician that sutures should be fine, keep appointment scheduled, verbalized understanding.

## 2022-07-23 ENCOUNTER — Ambulatory Visit: Payer: Medicare Other

## 2022-07-23 ENCOUNTER — Ambulatory Visit (INDEPENDENT_AMBULATORY_CARE_PROVIDER_SITE_OTHER): Payer: Medicare Other

## 2022-07-23 VITALS — BP 140/80 | HR 78

## 2022-07-23 DIAGNOSIS — M76821 Posterior tibial tendinitis, right leg: Secondary | ICD-10-CM

## 2022-07-23 NOTE — Progress Notes (Signed)
Patient presents today for post op visit # 2, patient of Dr. Posey Pronto.    POV #2 DOS 07/01/22 -- RIGHT POST TIBIAL TENDON REPAIR WITH POSSIBLE FLEX TENDON TRANSFER WITH USE OF POSSIBLE ANCHOR    Staples were removed today without complications. Patient tolerated staple removal well. Incisions look good and no signs of infections. PT ordered written by Dr. Posey Pronto, patient may begin put weight on the surgical foot with boot on at all times while ambulating. Patient verbalized understanding.   Reviewed icing and elevation. Patient will follow up with Dr. Posey Pronto  for POV# 3 in 4 weeks.

## 2022-07-24 ENCOUNTER — Other Ambulatory Visit: Payer: Medicare Other

## 2022-07-30 ENCOUNTER — Telehealth: Payer: Self-pay | Admitting: *Deleted

## 2022-07-30 NOTE — Telephone Encounter (Signed)
Patient has been updated.

## 2022-07-30 NOTE — Telephone Encounter (Signed)
Patient is wanting to know when she can go swimming? Please advise.

## 2022-08-06 ENCOUNTER — Ambulatory Visit: Payer: Medicare Other

## 2022-08-13 ENCOUNTER — Telehealth: Payer: Self-pay | Admitting: *Deleted

## 2022-08-13 NOTE — Telephone Encounter (Signed)
Patient updated and she will wait until her upcoming appointment.

## 2022-08-13 NOTE — Telephone Encounter (Signed)
Patient is calling to ask if she can go swimming 6 wks in boot, please advise.

## 2022-08-22 ENCOUNTER — Ambulatory Visit (INDEPENDENT_AMBULATORY_CARE_PROVIDER_SITE_OTHER): Payer: Medicare Other | Admitting: Podiatry

## 2022-08-22 DIAGNOSIS — M76821 Posterior tibial tendinitis, right leg: Secondary | ICD-10-CM

## 2022-08-22 DIAGNOSIS — Z9889 Other specified postprocedural states: Secondary | ICD-10-CM

## 2022-08-22 NOTE — Progress Notes (Signed)
Subjective:  Patient ID: [name], female    DOB: [birth year],  MRN: [id number]  Chief Complaint  Patient presents with   Foot Pain    Pt stated that she is doing well     DOS: 07/01/2022 Procedure: Right posterior tibial tendon repair with Kidner advancement  67 y.o. female returns for post-op check.  Patient states she is doing well minimal complaints.  No acute complaints.  Weightbearing as tolerated in cam boot  Review of Systems: Negative except as noted in the HPI. Denies N/V/F/Ch.  Past Medical History:  Diagnosis Date   Anemia    Anxiety    Arthritis    Headache    Hyperlipemia    Hypertension    Pre-diabetes    Scoliosis     Current Outpatient Medications:    ALPRAZolam (XANAX) 0.5 MG tablet, Take 1 tablet (0.5 mg total) by mouth 2 (two) times daily as needed for anxiety., Disp: 20 tablet, Rfl: 0   amLODipine (NORVASC) 10 MG tablet, Take 10 mg by mouth daily., Disp: , Rfl:    aspirin EC 325 MG tablet, Take 1 tablet (325 mg total) by mouth 2 (two) times daily., Disp: 60 tablet, Rfl: 0   atorvastatin (LIPITOR) 80 MG tablet, Take 80 mg by mouth daily., Disp: , Rfl:    baclofen (LIORESAL) 10 MG tablet, Take 1 tablet (10 mg total) by mouth 3 (three) times daily. As needed for muscle spasm, Disp: 30 tablet, Rfl: 0   clobetasol cream (TEMOVATE) 0.05 %, Apply 1 application. topically every 3 (three) days., Disp: , Rfl:    DULoxetine (CYMBALTA) 30 MG capsule, Take 30 mg by mouth daily., Disp: , Rfl:    ferrous sulfate 325 (65 FE) MG tablet, Take 325 mg by mouth daily with breakfast., Disp: , Rfl:    ibuprofen (ADVIL) 800 MG tablet, Take 1 tablet (800 mg total) by mouth every 6 (six) hours as needed., Disp: 60 tablet, Rfl: 1   losartan (COZAAR) 50 MG tablet, Take 50 mg by mouth 2 (two) times daily., Disp: , Rfl:    magnesium gluconate (MAGONATE) 500 MG tablet, Take 500 mg by mouth 2 (two) times daily., Disp: , Rfl:    Multiple Vitamins-Minerals (MULTIVITAMIN PO), Take 1  tablet by mouth daily., Disp: , Rfl:    ondansetron (ZOFRAN) 4 MG tablet, Take 1 tablet (4 mg total) by mouth every 8 (eight) hours as needed for nausea or vomiting., Disp: 10 tablet, Rfl: 0   oxyCODONE-acetaminophen (PERCOCET) 5-325 MG tablet, Take 1-2 tablets by mouth every 4 (four) hours as needed for severe pain., Disp: 40 tablet, Rfl: 0   oxyCODONE-acetaminophen (PERCOCET) 5-325 MG tablet, Take 1-2 tablets by mouth every 4 (four) hours as needed for severe pain., Disp: 30 tablet, Rfl: 0   Probiotic Product (PROBIOTIC DAILY PO), Take 1 capsule by mouth daily., Disp: , Rfl:    sennosides-docusate sodium (SENOKOT-S) 8.6-50 MG tablet, Take 2 tablets by mouth daily., Disp: 30 tablet, Rfl: 1   spironolactone (ALDACTONE) 25 MG tablet, Take 25 mg by mouth daily., Disp: , Rfl:    Turmeric 500 MG CAPS, Take 500 mg by mouth daily., Disp: , Rfl:    valACYclovir (VALTREX) 1000 MG tablet, Take 1,000 mg by mouth daily as needed (cold sores)., Disp: , Rfl:   Social History   Tobacco Use  Smoking Status Never  Smokeless Tobacco Never    Allergies  Allergen Reactions   Ace Inhibitors Cough  Crestor [Rosuvastatin] Other (See Comments)    Leg cramps   Latex     rash   Penicillins     Vaginal Infection    Objective:  There were no vitals filed for this visit. There is no height or weight on file to calculate BMI. Constitutional Well developed. Well nourished.  Vascular Foot warm and well perfused. Capillary refill normal to all digits.   Neurologic Normal speech. Oriented to person, place, and time. Epicritic sensation to light touch grossly present bilaterally.  Dermatologic Skin completely epithelialized.  No signs of dehiscence or complication noted.  Good range of motion and strength noted to the posterior tibial tendon.  Tendon strength 4 out of 5  Orthopedic: No further tenderness to palpation noted about the surgical site.   Radiographs: 3 discussion with Ortho right foot: Good  alignment noted.  Anchor is within normal limits.  Good correction noted.  Exostectomy of the navicular bone on the Assessment:   No diagnosis found.  Plan:  Patient was evaluated and treated and all questions answered.  S/p foot surgery right -Progressing as expected post-operatively. -XR: With normal limits -WB Status: Weightbearing as tolerated in regular shoes -Sutures: None -Medications: None -Continue physical therapy.  Patient can return to regular activities.  I will see her back for final follow-up in 4 weeks  No follow-ups on file.

## 2022-09-10 ENCOUNTER — Ambulatory Visit: Payer: Medicare Other | Admitting: Family Medicine

## 2022-09-17 ENCOUNTER — Telehealth: Payer: Self-pay | Admitting: Podiatry

## 2022-09-17 NOTE — Telephone Encounter (Signed)
Pt left message today at 1203pm to cxl appt for tomorrow as she is doing well and did not need to reschedule at this time.   I returned call and left message I received her message and have canceled the appt and to call if she needs an appt.

## 2022-09-20 ENCOUNTER — Ambulatory Visit: Payer: Medicare Other | Admitting: Podiatry

## 2022-10-15 ENCOUNTER — Ambulatory Visit: Payer: Medicare Other | Admitting: Family Medicine

## 2022-11-11 ENCOUNTER — Encounter: Payer: Self-pay | Admitting: Family Medicine

## 2022-11-11 ENCOUNTER — Ambulatory Visit (INDEPENDENT_AMBULATORY_CARE_PROVIDER_SITE_OTHER): Payer: Medicare Other | Admitting: Family Medicine

## 2022-11-11 VITALS — BP 160/80 | HR 69 | Temp 98.3°F | Resp 18 | Ht 65.0 in | Wt 217.5 lb

## 2022-11-11 DIAGNOSIS — E1165 Type 2 diabetes mellitus with hyperglycemia: Secondary | ICD-10-CM

## 2022-11-11 DIAGNOSIS — Z7984 Long term (current) use of oral hypoglycemic drugs: Secondary | ICD-10-CM

## 2022-11-11 DIAGNOSIS — E782 Mixed hyperlipidemia: Secondary | ICD-10-CM

## 2022-11-11 DIAGNOSIS — I10 Essential (primary) hypertension: Secondary | ICD-10-CM | POA: Diagnosis not present

## 2022-11-11 DIAGNOSIS — I7121 Aneurysm of the ascending aorta, without rupture: Secondary | ICD-10-CM

## 2022-11-11 MED ORDER — LOSARTAN POTASSIUM 100 MG PO TABS: 100.0000 mg | ORAL_TABLET | Freq: Every day | ORAL | 1 refills | Status: DC

## 2022-11-11 MED ORDER — SPIRONOLACTONE 25 MG PO TABS: 25.0000 mg | ORAL_TABLET | Freq: Every day | ORAL | 1 refills | Status: DC

## 2022-11-11 MED ORDER — EZETIMIBE 10 MG PO TABS: 10.0000 mg | ORAL_TABLET | Freq: Every day | ORAL | 1 refills | Status: DC

## 2022-11-11 MED ORDER — ATORVASTATIN CALCIUM 80 MG PO TABS: 80.0000 mg | ORAL_TABLET | Freq: Every day | ORAL | 1 refills | Status: DC

## 2022-11-11 MED ORDER — AMLODIPINE BESYLATE 10 MG PO TABS: 10.0000 mg | ORAL_TABLET | Freq: Every day | ORAL | 1 refills | Status: DC

## 2022-11-11 MED ORDER — METFORMIN HCL 500 MG PO TABS
500.0000 mg | ORAL_TABLET | Freq: Every day | ORAL | 1 refills | Status: DC
Start: 2022-11-11 — End: 2023-05-09

## 2022-11-11 NOTE — Patient Instructions (Signed)
Welcome to [facility] at [facility] [place]! It was a pleasure meeting you today.  As discussed, Please schedule a 3 month follow up visit today.  PLEASE NOTE:  If you had any LAB tests please let us know if you have not heard back within a few days. You may see your results on MyChart before we have a chance to review them but we will give you a call once they are reviewed by us. If we ordered any REFERRALS today, please let us know if you have not heard from their office within the next week.  Let us know through MyChart if you are needing REFILLS, or have your pharmacy send us the request. You can also use MyChart to communicate with me or any office staff.  Please try these tips to maintain a healthy lifestyle:  Eat most of your calories during the day when you are active. Eliminate processed foods including packaged sweets (pies, cakes, cookies), reduce intake of potatoes, white bread, white pasta, and white rice. Look for whole grain options, oat flour or almond flour.  Each meal should contain half fruits/vegetables, one quarter protein, and one quarter carbs (no bigger than a computer mouse).  Cut down on sweet beverages. This includes juice, soda, and sweet tea. Also watch fruit intake, though this is a healthier sweet option, it still contains natural sugar! Limit to 3 servings daily.  Drink at least 1 glass of water with each meal and aim for at least 8 glasses per day  Exercise at least 150 minutes every week.   

## 2022-11-11 NOTE — Assessment & Plan Note (Signed)
Chronic.  Controlled outpatient.  Per patient, whitecoat so not controlled here.  Continue amlodipine 10 mg daily losartan 100 mg daily and spironolactone 25 mg daily.  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  She has a new patient appoint with cardiology in September.

## 2022-11-11 NOTE — Assessment & Plan Note (Signed)
Type 2 diabetes.  Patient has been struggling with exercise for many months due to multiple surgeries.  She is finally back on track.  Working on her diet as well.  She would like to start back on metformin 500 mg daily.  Intolerant of Ozempic.

## 2022-11-11 NOTE — Progress Notes (Signed)
New Patient Office Visit  Subjective:  Patient ID: [name], female    DOB: 09/06/1955  Age: 67 y.o. MRN: [id number]  CC:  Chief Complaint  Patient presents with   Establish Care    Initial visit to establish care with new pcp Not fasting     HPI [name] presents to establish care.  HTN - She is complaint with Spironolactone 25 mg  Losartan 100mg  daily, and Amlodipine 10 mg daily. Has been monitoring her blood pressure at home, readings averaging around 130s/high 70s. State exercising has helped. Her blood pressure was elevated at initial check, 142/80. When recheck, her blood pressure elevated to 160/80, which she attributes to white coat syndrome.  Diabetes, Type 2 - Pt reports her diabetes is well controlled. Monitors her blood sugar at home. Was previously on Metformin 500 mg and stopped due to her blood sugar levels being too low. Has tried Ozempic for 3-4 months, stopped due to nausea. Wants to get back on metformin.  A1C was 6.6 in May.  Has struggled w/exercise as knee surgeries, etc.  Back to exercise now.  Hyperlipidemia - Managing with Atorvastatin 80 mg daily and Zetia 10 mg(added in May).   Health maintenance - Exercises daily, either swimming or cardio/walking for 45-60 minutes.  Cough - She reports having a cough at nighttime when lying down. Denies any heartburn. Used to use a humidifier in the winter, does not use it during the summer.  Neck pain - Has hx of neck pain and has been taking Cymbalta 30 mg every other day-not really doing anything.  Injection helped more.   Has thoracic aneurysm seen incidentally on CT chest.  Will see Card in Sept. .    Current Outpatient Medications:    ALPRAZolam (XANAX) 0.5 MG tablet, Take 1 tablet (0.5 mg total) by mouth 2 (two) times daily as needed for anxiety., Disp: 20 tablet, Rfl: 0   Cholecalciferol 125 MCG (5000 UT) capsule, Take by mouth., Disp: , Rfl:    clobetasol cream (TEMOVATE) 0.05 %, Apply 1  application. topically every 3 (three) days., Disp: , Rfl:    DULoxetine (CYMBALTA) 30 MG capsule, Take by mouth every other day., Disp: , Rfl:    ferrous sulfate 325 (65 FE) MG tablet, Take 325 mg by mouth daily with breakfast., Disp: , Rfl:    fluticasone (FLONASE) 50 MCG/ACT nasal spray, Place 2 sprays into the nose as needed., Disp: , Rfl:    metFORMIN (GLUCOPHAGE) 500 MG tablet, Take 1 tablet (500 mg total) by mouth daily with breakfast., Disp: 90 tablet, Rfl: 1   methocarbamol (ROBAXIN) 500 MG tablet, Take by mouth as needed., Disp: , Rfl:    Multiple Vitamins-Minerals (MULTIVITAMIN PO), Take 1 tablet by mouth daily., Disp: , Rfl:    Probiotic Product (PROBIOTIC DAILY PO), Take 1 capsule by mouth daily., Disp: , Rfl:    Turmeric 500 MG CAPS, Take 500 mg by mouth daily., Disp: , Rfl:    valACYclovir (VALTREX) 1000 MG tablet, Take 1,000 mg by mouth daily as needed (cold sores)., Disp: , Rfl:    amLODipine (NORVASC) 10 MG tablet, Take 1 tablet (10 mg total) by mouth daily., Disp: 90 tablet, Rfl: 1   atorvastatin (LIPITOR) 80 MG tablet, Take 1 tablet (80 mg total) by mouth daily., Disp: 90 tablet, Rfl: 1   ezetimibe (ZETIA) 10 MG tablet, Take 1 tablet (10 mg total) by mouth daily., Disp: 90 tablet, Rfl: 1   losartan (COZAAR)  100 MG tablet, Take 1 tablet (100 mg total) by mouth daily., Disp: 90 tablet, Rfl: 1   spironolactone (ALDACTONE) 25 MG tablet, Take 1 tablet (25 mg total) by mouth daily., Disp: 90 tablet, Rfl: 1  Past Medical History:  Diagnosis Date   Anemia    Anxiety    Arthritis    Diabetes mellitus without complication (HCC)    Headache    Hyperlipemia    Hypertension    Lung nodule    Pre-diabetes    Scoliosis    Thoracic aortic aneurysm (HCC)     Past Surgical History:  Procedure Laterality Date   ANKLE SURGERY Right    BREAST BIOPSY     CESAREAN SECTION     x3   CHOLECYSTECTOMY, LAPAROSCOPIC     ESOPHAGOGASTRODUODENOSCOPY     HEMORRHOID SURGERY     PARTIAL KNEE  ARTHROPLASTY Right 10/09/2021   Procedure: UNICOMPARTMENTAL KNEE;  Surgeon: Teryl Lucy, MD;  Location: [facility];  Service: Orthopedics;  Laterality: Right;   TOTAL KNEE ARTHROPLASTY Left 12/04/2012        Family History  Problem Relation Age of Onset   Diabetes Mother    COPD Mother    Hypertension Mother    Early death Father    Diabetes Father    Hypertension Father    Heart disease Father    Hyperlipidemia Sister    Hypertension Sister    Heart disease Brother    Hyperlipidemia Brother    Hypertension Brother     Social History   Socioeconomic History   Marital status: Married    Spouse name: Not on file   Number of children: 3   Years of education: Not on file   Highest education level: Not on file  Occupational History   Not on file  Tobacco Use   Smoking status: Never   Smokeless tobacco: Never  Vaping Use   Vaping Use: Never used  Substance and Sexual Activity   Alcohol use: Yes    Alcohol/week: 5.0 - 6.0 standard drinks of alcohol    Types: 5 - 6 Standard drinks or equivalent per week    Comment: 2 glass 2 times per  week   Drug use: Never   Sexual activity: Yes    Partners: Male    Birth control/protection: Post-menopausal  Other Topics Concern   Not on file  Social History Narrative   1 grand   Social Determinants of Health   Financial Resource Strain: Not on file  Food Insecurity: Not on file  Transportation Needs: Not on file  Physical Activity: Not on file  Stress: Not on file  Social Connections: Not on file  Intimate Partner Violence: Not on file    ROS  ROS: Gen: no fever, chills  Skin: no rash, itching ENT: no ear pain, ear drainage, nasal congestion, rhinorrhea, sinus pressure, sore throat Eyes: no blurry vision, double vision Resp: no wheeze or SOB. +Cough (see HPI) CV: no CP, palpitations, LE edema,  GI: no heartburn, n/v/d/c, abd pain GU: no dysuria, urgency, frequency, hematuria MSK: no joint pain, myalgias, back  pain Neuro: no dizziness, headache, weakness, vertigo Psych: no depression, anxiety, insomnia, SI   Objective:   Today's Vitals: BP (!) 160/80 (BP Location: Left Arm, Patient Position: Sitting, Cuff Size: Large)   Pulse 69   Temp 98.3 F (36.8 C) (Temporal)   Resp 18   Ht 5\' 5"  (1.651 m)   Wt 217 lb 8 oz (98.7 kg)  LMP 05/06/2002   SpO2 98%   BMI 36.19 kg/m   Physical Exam  Gen: WDWN NAD HEENT: NCAT, conjunctiva not injected, sclera nonicteric TM WNL B, OP moist, no exudates  NECK:  supple, no thyromegaly, no nodes, no carotid bruits CARDIAC: RRR, S1S2+, no murmur. DP 2+B LUNGS: CTAB. No wheezes ABDOMEN:  BS+, soft, NTND, No HSM, no masses EXT:  no edema MSK: no gross abnormalities.  NEURO: A&O x3.  CN II-XII intact.  PSYCH: normal mood. Good eye contact   Assessment & Plan:  Type 2 diabetes mellitus with hyperglycemia, without long-term current use of insulin (HCC) Assessment & Plan: Type 2 diabetes.  Patient has been struggling with exercise for many months due to multiple surgeries.  She is finally back on track.  Working on her diet as well.  She would like to start back on metformin 500 mg daily.  Intolerant of Ozempic.  Orders: -     Hemoglobin A1c; Future -     Comprehensive metabolic panel; Future -     Microalbumin / creatinine urine ratio; Future  HYPERTENSION, SEVERE Assessment & Plan: Chronic.  Controlled outpatient.  Per patient, whitecoat so not controlled here.  Continue amlodipine 10 mg daily losartan 100 mg daily and spironolactone 25 mg daily.  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  She has a new patient appoint with cardiology in September.   Mixed hyperlipidemia Assessment & Plan: Chronic.  Not controlled.  Continue atorvastatin 80 mg daily.  Zetia 10 mg daily was added May 2024.  Check labs in 1 month  Orders: -     Comprehensive metabolic panel; Future -     Lipid panel; Future  Aneurysm of ascending aorta  without rupture [facility]) Assessment & Plan: Chronic.  Stable.  On statin.  Antihypertensives.  Monitor blood pressures closely.  She does have an appoint with cardiology in September   Other orders -     metFORMIN HCl; Take 1 tablet (500 mg total) by mouth daily with breakfast.  Dispense: 90 tablet; Refill: 1 -     amLODIPine Besylate; Take 1 tablet (10 mg total) by mouth daily.  Dispense: 90 tablet; Refill: 1 -     Atorvastatin Calcium; Take 1 tablet (80 mg total) by mouth daily.  Dispense: 90 tablet; Refill: 1 -     Ezetimibe; Take 1 tablet (10 mg total) by mouth daily.  Dispense: 90 tablet; Refill: 1 -     Losartan Potassium; Take 1 tablet (100 mg total) by mouth daily.  Dispense: 90 tablet; Refill: 1 -     Spironolactone; Take 1 tablet (25 mg total) by mouth daily.  Dispense: 90 tablet; Refill: 1    Follow-up: Return in about 3 months (around 02/11/2023) for DM, HTN, cholesterol-46mo.    After Aug 10 for labs.    I,Rachel Rivera,acting as a scribe for Angelena Sole, MD.,have documented all relevant documentation on the behalf of Angelena Sole, MD,as directed by  Angelena Sole, MD while in the presence of Angelena Sole, MD.  I, Angelena Sole, MD, have reviewed all documentation for this visit. The documentation on 11/11/22 for the exam, diagnosis, procedures, and orders are all accurate and complete.   Angelena Sole, MD

## 2022-11-11 NOTE — Assessment & Plan Note (Signed)
Chronic.  Stable.  On statin.  Antihypertensives.  Monitor blood pressures closely.  She does have an appoint with cardiology in September

## 2022-11-11 NOTE — Assessment & Plan Note (Signed)
Chronic.  Not controlled.  Continue atorvastatin 80 mg daily.  Zetia 10 mg daily was added May 2024.  Check labs in 1 month

## 2022-12-10 ENCOUNTER — Other Ambulatory Visit (INDEPENDENT_AMBULATORY_CARE_PROVIDER_SITE_OTHER): Payer: Medicare Other

## 2022-12-10 DIAGNOSIS — E782 Mixed hyperlipidemia: Secondary | ICD-10-CM

## 2022-12-10 DIAGNOSIS — E1165 Type 2 diabetes mellitus with hyperglycemia: Secondary | ICD-10-CM

## 2022-12-10 LAB — MICROALBUMIN / CREATININE URINE RATIO
Creatinine,U: 184.4 mg/dL
Microalb Creat Ratio: 1.2 mg/g (ref 0.0–30.0)
Microalb, Ur: 2.3 mg/dL — ABNORMAL HIGH (ref 0.0–1.9)

## 2022-12-10 LAB — LIPID PANEL
Cholesterol: 211 mg/dL — ABNORMAL HIGH (ref 0–200)
HDL: 58.6 mg/dL (ref >39.00)
NonHDL: 152.73
Total CHOL/HDL Ratio: 4
Triglycerides: 211 mg/dL — ABNORMAL HIGH (ref 0.0–149.0)
VLDL: 42.2 mg/dL — ABNORMAL HIGH (ref 0.0–40.0)

## 2022-12-10 LAB — COMPREHENSIVE METABOLIC PANEL
ALT: 30 U/L (ref 0–35)
AST: 22 U/L (ref 0–37)
Albumin: 4.7 g/dL (ref 3.5–5.2)
Alkaline Phosphatase: 54 U/L (ref 39–117)
BUN: 15 mg/dL (ref 6–23)
CO2: 26 mEq/L (ref 19–32)
Calcium: 10 mg/dL (ref 8.4–10.5)
Chloride: 100 mEq/L (ref 96–112)
Creatinine, Ser: 0.73 mg/dL (ref 0.40–1.20)
GFR: 85.87 mL/min (ref >60.00)
Glucose, Bld: 136 mg/dL — ABNORMAL HIGH (ref 70–99)
Potassium: 4.3 mEq/L (ref 3.5–5.1)
Sodium: 136 mEq/L (ref 135–145)
Total Bilirubin: 0.5 mg/dL (ref 0.2–1.2)
Total Protein: 7.5 g/dL (ref 6.0–8.3)

## 2022-12-10 LAB — LDL CHOLESTEROL, DIRECT: Direct LDL: 138 mg/dL

## 2022-12-10 LAB — HEMOGLOBIN A1C: Hgb A1c MFr Bld: 6.8 % — ABNORMAL HIGH (ref 4.6–6.5)

## 2022-12-12 NOTE — Progress Notes (Signed)
Labs look good except cholesterol.  Already on max meds.  Keep working on diet/exercise.

## 2022-12-18 ENCOUNTER — Telehealth: Payer: Self-pay | Admitting: Family Medicine

## 2022-12-18 NOTE — Telephone Encounter (Signed)
Patient states she doing all of her exercises, etc., however is still not losing weight. States the Ozempic is not helping and makes her nauseas.  Requests RX for [phone] (to replace Ozempic) Be sent to:   [facility] [id number], [place] - [address] Phone: [phone]  Fax: [phone]     Patient requests to be advised.

## 2022-12-18 NOTE — Telephone Encounter (Signed)
Please see message below and advise.

## 2022-12-19 ENCOUNTER — Other Ambulatory Visit: Payer: Self-pay | Admitting: *Deleted

## 2022-12-19 DIAGNOSIS — I10 Essential (primary) hypertension: Secondary | ICD-10-CM

## 2022-12-19 DIAGNOSIS — E1165 Type 2 diabetes mellitus with hyperglycemia: Secondary | ICD-10-CM

## 2022-12-19 MED ORDER — TIRZEPATIDE 2.5 MG/0.5ML ~~LOC~~ SOAJ
2.5000 mg | SUBCUTANEOUS | 1 refills | Status: DC
Start: 2022-12-19 — End: 2023-02-25

## 2022-12-19 NOTE — Telephone Encounter (Signed)
Pt would like a call back for below message.

## 2022-12-19 NOTE — Telephone Encounter (Signed)
Left message to return call 

## 2022-12-19 NOTE — Telephone Encounter (Signed)
Patient informed of message below. Mounjaro sent to the pharmacy per patient request.

## 2023-01-08 ENCOUNTER — Ambulatory Visit (HOSPITAL_BASED_OUTPATIENT_CLINIC_OR_DEPARTMENT_OTHER): Payer: Medicare Other | Admitting: Cardiovascular Disease

## 2023-02-10 LAB — HM DIABETES EYE EXAM

## 2023-02-11 ENCOUNTER — Ambulatory Visit: Payer: Medicare Other | Admitting: Family Medicine

## 2023-02-25 ENCOUNTER — Ambulatory Visit (INDEPENDENT_AMBULATORY_CARE_PROVIDER_SITE_OTHER): Payer: Medicare Other | Admitting: Family Medicine

## 2023-02-25 ENCOUNTER — Encounter: Payer: Self-pay | Admitting: Family Medicine

## 2023-02-25 VITALS — BP 160/80 | HR 73 | Temp 98.2°F | Resp 18 | Ht 65.0 in | Wt 220.5 lb

## 2023-02-25 DIAGNOSIS — Z7984 Long term (current) use of oral hypoglycemic drugs: Secondary | ICD-10-CM | POA: Diagnosis not present

## 2023-02-25 DIAGNOSIS — E1165 Type 2 diabetes mellitus with hyperglycemia: Secondary | ICD-10-CM | POA: Diagnosis not present

## 2023-02-25 DIAGNOSIS — M4802 Spinal stenosis, cervical region: Secondary | ICD-10-CM

## 2023-02-25 DIAGNOSIS — R911 Solitary pulmonary nodule: Secondary | ICD-10-CM

## 2023-02-25 DIAGNOSIS — Z1211 Encounter for screening for malignant neoplasm of colon: Secondary | ICD-10-CM

## 2023-02-25 DIAGNOSIS — I10 Essential (primary) hypertension: Secondary | ICD-10-CM | POA: Diagnosis not present

## 2023-02-25 DIAGNOSIS — E782 Mixed hyperlipidemia: Secondary | ICD-10-CM

## 2023-02-25 MED ORDER — SEMAGLUTIDE(0.25 OR 0.5MG/DOS) 2 MG/3ML ~~LOC~~ SOPN
0.2500 mg | PEN_INJECTOR | SUBCUTANEOUS | 0 refills | Status: DC
Start: 2023-02-25 — End: 2023-02-28

## 2023-02-25 MED ORDER — DULOXETINE HCL 30 MG PO CPEP: 30.0000 mg | ORAL_CAPSULE | Freq: Every day | ORAL | 3 refills | Status: DC

## 2023-02-25 NOTE — Progress Notes (Signed)
Subjective:     Patient ID: [name], female    DOB: 01-24-1956, 67 y.o.   MRN: [id number]  Chief Complaint  Patient presents with   Medical Management of Chronic Issues    3 month follow-up on htn, hld, dm Discuss when next CT scan of chest will be    HPI HTN - Pt is on  losartan 100 mg, amlodipine 10mg  daily, and spironolactone 25mg  .Bp's running 130/70-uses different cuffs. No ha/dizziness/cp/palp/edema/cough/sob. Bp at initial check was 165/80. Bp at recheck was 160/80. exercising  DM Type 2 - Pt is on Metformin 500 mg daily. Pt states that Mounjaro 2.5 mg injections weekly was too expensive, and discontinued Ozmepic due to constipation and nausea. Sugars are running 90s-110s with unusual spikes into the 200s. Just can't seem to lose wt.  Cervicalgia-cymbalta 30mg  every other day-thought it was not helping so she stopped, however things got a lot worse so she is back on it  CT Scan - Due for repeat CT in April 2025. Lung nodules and thoracic aneurysm.  Had to reschedule cardiology appointment from September so now not going to be seen until February 10.  Reviewed procedure notes. Liver cysts-MRI 03/2022.  U/s around April-didn't see them.  Pt wondering on f/u  Health Maintenance Due  Topic Date Due   Colonoscopy  12/05/2022    Past Medical History:  Diagnosis Date   Anemia    Anxiety    Arthritis    Diabetes mellitus without complication (HCC)    Headache    Hyperlipemia    Hypertension    Lung nodule    Pre-diabetes    Scoliosis    Thoracic aortic aneurysm (HCC)     Past Surgical History:  Procedure Laterality Date   ANKLE SURGERY Right    BREAST BIOPSY     CESAREAN SECTION     x3   CHOLECYSTECTOMY, LAPAROSCOPIC     ESOPHAGOGASTRODUODENOSCOPY     HEMORRHOID SURGERY     PARTIAL KNEE ARTHROPLASTY Right 10/09/2021   Procedure: UNICOMPARTMENTAL KNEE;  Surgeon: Teryl Lucy, MD;  Location: [facility];  Service: Orthopedics;  Laterality: Right;   TOTAL KNEE  ARTHROPLASTY Left 12/04/2012         Current Outpatient Medications:    ALPRAZolam (XANAX) 0.5 MG tablet, Take 1 tablet (0.5 mg total) by mouth 2 (two) times daily as needed for anxiety., Disp: 20 tablet, Rfl: 0   amLODipine (NORVASC) 10 MG tablet, Take 1 tablet (10 mg total) by mouth daily., Disp: 90 tablet, Rfl: 1   atorvastatin (LIPITOR) 80 MG tablet, Take 1 tablet (80 mg total) by mouth daily., Disp: 90 tablet, Rfl: 1   Cholecalciferol 125 MCG (5000 UT) capsule, Take by mouth., Disp: , Rfl:    clobetasol cream (TEMOVATE) 0.05 %, Apply 1 application. topically every 3 (three) days., Disp: , Rfl:    ezetimibe (ZETIA) 10 MG tablet, Take 1 tablet (10 mg total) by mouth daily., Disp: 90 tablet, Rfl: 1   ferrous sulfate 325 (65 FE) MG tablet, Take 325 mg by mouth daily with breakfast., Disp: , Rfl:    fluticasone (FLONASE) 50 MCG/ACT nasal spray, Place 2 sprays into the nose as needed., Disp: , Rfl:    losartan (COZAAR) 100 MG tablet, Take 1 tablet (100 mg total) by mouth daily., Disp: 90 tablet, Rfl: 1   metFORMIN (GLUCOPHAGE) 500 MG tablet, Take 1 tablet (500 mg total) by mouth daily with breakfast., Disp: 90 tablet, Rfl: 1   methocarbamol (  ROBAXIN) 500 MG tablet, Take by mouth as needed., Disp: , Rfl:    Multiple Vitamins-Minerals (MULTIVITAMIN PO), Take 1 tablet by mouth daily., Disp: , Rfl:    Probiotic Product (PROBIOTIC DAILY PO), Take 1 capsule by mouth daily., Disp: , Rfl:    Semaglutide,0.25 or 0.5MG /DOS, 2 MG/3ML SOPN, Inject 0.25 mg into the skin once a week., Disp: 3 mL, Rfl: 0   spironolactone (ALDACTONE) 25 MG tablet, Take 1 tablet (25 mg total) by mouth daily., Disp: 90 tablet, Rfl: 1   Turmeric 500 MG CAPS, Take 500 mg by mouth daily., Disp: , Rfl:    valACYclovir (VALTREX) 1000 MG tablet, Take 1,000 mg by mouth daily as needed (cold sores)., Disp: , Rfl:    DULoxetine (CYMBALTA) 30 MG capsule, Take 1 capsule (30 mg total) by mouth daily., Disp: 90 capsule, Rfl: 3  Allergies   Allergen Reactions   Ace Inhibitors Cough    Other Reaction(s): cough, Other (See Comments)  other   Latex     rash   Penicillins     Vaginal Infection    Rosuvastatin Other (See Comments)    Leg cramps  Other Reaction(s): Other (See Comments)  Other   ROS neg/noncontributory except as noted HPI/below      Objective:     BP (!) 160/80   Pulse 73   Temp 98.2 F (36.8 C) (Temporal)   Resp 18   Ht 5\' 5"  (1.651 m)   Wt 220 lb 8 oz (100 kg)   LMP 05/06/2002   SpO2 97%   BMI 36.69 kg/m  Wt Readings from Last 3 Encounters:  02/25/23 220 lb 8 oz (100 kg)  11/11/22 217 lb 8 oz (98.7 kg)  12/24/21 220 lb 12.8 oz (100.2 kg)   Physical Exam   Gen: WDWN NAD HEENT: NCAT, conjunctiva not injected, sclera nonicteric NECK:  supple, no thyromegaly, no nodes, no carotid bruits CARDIAC: RRR, S1S2+, no murmur. DP 2+B LUNGS: CTAB. No wheezes  ABDOMEN:  BS+, soft, NTND, No HSM, no masses EXT:  no edema MSK: no gross abnormalities.  NEURO: A&O x3.  CN II-XII intact.  PSYCH: normal mood. Good eye contact  11/23/21-CT abd-Hepatobiliary: There is diffuse fatty infiltration of the liver parenchyma. Multiple well-defined mildly lobulated areas of parenchymal low attenuation are seen within the right lobe of the liver. The largest measures approximately 2.0 cm x 1.2 cm and is seen within the anterior aspect of the right lobe. Status post cholecystectomy. No biliary dilatation.  01/25/22-u/s liver Multiple masses are identified in the liver on the comparison CT scan. Only 2 are visualized on today's ultrasound. There is a 1.5 cm cyst posteriorly in the right hepatic lobe which does not require follow-up imaging. There is a 1.8 cm hypoechoic mass more anteriorly n the right hepatic lobe which could represent a complicated cyst or solid mass. Based on CT imaging, unless the patient has a known malignancy, I suspect all the masses are benign but many cannot be completely characterized.  An MRI of the abdomen with and without contrast could completely characterize these lesions. 03/25/22-MRI abd-1. Benign 2.5 cm hepatic hemangioma in the inferior right lobe of the liver.2. Bilobar hepatic cysts measuring up to 2.2 cm.  i     Spent 60 min total-reviewing notes w/pt, more review after pt left Assessment & Plan:  Essential hypertension, malignant Assessment & Plan: Chronic.  Controlled outpatient.  Per patient, whitecoat so not controlled here.  Continue amlodipine 10 mg daily losartan  100 mg daily and spironolactone 25 mg daily.  goal less than 130/80 due to ascending thoracic aneurysm.  She has a new patient appoint with cardiology in February.   Type 2 diabetes mellitus with hyperglycemia, without long-term current use of insulin (HCC) Assessment & Plan: Chronic.  Controlled.  Continue metformin 500 mg daily.  Work on diet/exercise.  She is willing to go back on Ozempic to try to assist with weight loss.  Advised that the whole class has constipation/nausea side effects.  Take MiraLAX daily and Colace 100 to 300 mg daily to prevent constipation.  Cost of Greggory Keen was prohibitive.  Start Ozempic 0.25 mg weekly and call monthly for increase  Orders: -     Semaglutide(0.25 or 0.5MG /DOS); Inject 0.25 mg into the skin once a week.  Dispense: 3 mL; Refill: 0  Mixed hyperlipidemia Assessment & Plan: Chronic.  Not well-controlled.  Patient is already on 80 mg of Lipitor and 10 mg of Zetia.  Intolerant of Crestor.   Spinal stenosis of cervical region Assessment & Plan: Chronic.  Has a lot of pain, however better controlled when on Cymbalta 30 mg every other day.  She does have a lot of other aches and pains, so recommended taking it daily  Orders: -     DULoxetine HCl; Take 1 capsule (30 mg total) by mouth daily.  Dispense: 90 capsule; Refill: 3  Lung nodule seen on imaging study Assessment & Plan: Chronic.  Had a CT in November and May.  Recommendation was annual.  Await  recommendations from cardiology for follow-up on thoracic aneurysm   Screening for colorectal cancer -     Ambulatory referral to Gastroenterology   Return in about 3 months (around 05/28/2023) for chronic follow-up.  Germaine Pomfret Rice,acting as a scribe for Angelena Sole, MD.,have documented all relevant documentation on the behalf of Angelena Sole, MD,as directed by  Angelena Sole, MD while in the presence of Angelena Sole, MD.  I, Angelena Sole, MD, have reviewed all documentation for this visit. The documentation on 02/25/23 for the exam, diagnosis, procedures, and orders are all accurate and complete.  Angelena Sole, MD

## 2023-02-25 NOTE — Patient Instructions (Signed)
It was very nice to see you today!  [facility] at [place] on the 1st floor Phone number [phone]    PLEASE NOTE:  If you had any lab tests please let us know if you have not heard back within a few days. You may see your results on MyChart before we have a chance to review them but we will give you a call once they are reviewed by [place]. If we ordered any referrals today, please let us know if you have not heard from their office within the next week.   Please try these tips to maintain a healthy lifestyle:  Eat most of your calories during the day when you are active. Eliminate processed foods including packaged sweets (pies, cakes, cookies), reduce intake of potatoes, white bread, white pasta, and white rice. Look for whole grain options, oat flour or almond flour.  Each meal should contain half fruits/vegetables, one quarter protein, and one quarter carbs (no bigger than a computer mouse).  Cut down on sweet beverages. This includes juice, soda, and sweet tea. Also watch fruit intake, though this is a healthier sweet option, it still contains natural sugar! Limit to 3 servings daily.  Drink at least 1 glass of water with each meal and aim for at least 8 glasses per day  Exercise at least 150 minutes every week.

## 2023-02-25 NOTE — Assessment & Plan Note (Signed)
Chronic.  Controlled outpatient.  Per patient, whitecoat so not controlled here.  Continue amlodipine 10 mg daily losartan 100 mg daily and spironolactone 25 mg daily.  goal less than 130/80 due to ascending thoracic aneurysm.  She has a new patient appoint with cardiology in February.

## 2023-02-25 NOTE — Assessment & Plan Note (Signed)
Chronic.  Controlled.  Continue metformin 500 mg daily.  Work on diet/exercise.  She is willing to go back on Ozempic to try to assist with weight loss.  Advised that the whole class has constipation/nausea side effects.  Take MiraLAX daily and Colace 100 to 300 mg daily to prevent constipation.  Cost of [name] was prohibitive.  Start Ozempic 0.25 mg weekly and call monthly for increase

## 2023-02-25 NOTE — Assessment & Plan Note (Signed)
Chronic.  Not well-controlled.  Patient is already on 80 mg of Lipitor and 10 mg of Zetia.  Intolerant of Crestor.

## 2023-02-25 NOTE — Assessment & Plan Note (Signed)
Chronic.  Had a CT in November and May.  Recommendation was annual.  Await recommendations from cardiology for follow-up on thoracic aneurysm

## 2023-02-25 NOTE — Assessment & Plan Note (Signed)
Chronic.  Has a lot of pain, however better controlled when on Cymbalta 30 mg every other day.  She does have a lot of other aches and pains, so recommended taking it daily

## 2023-02-26 ENCOUNTER — Encounter: Payer: Self-pay | Admitting: Family Medicine

## 2023-02-26 ENCOUNTER — Other Ambulatory Visit: Payer: Self-pay | Admitting: Family Medicine

## 2023-02-26 ENCOUNTER — Other Ambulatory Visit: Payer: Self-pay

## 2023-02-26 MED ORDER — METHOCARBAMOL 500 MG PO TABS
500.0000 mg | ORAL_TABLET | Freq: Four times a day (QID) | ORAL | 1 refills | Status: AC | PRN
  Filled 2023-02-26: qty 90, 23d supply, fill #0

## 2023-02-28 ENCOUNTER — Telehealth: Payer: Self-pay | Admitting: Family Medicine

## 2023-02-28 ENCOUNTER — Other Ambulatory Visit: Payer: Self-pay | Admitting: *Deleted

## 2023-02-28 DIAGNOSIS — E1165 Type 2 diabetes mellitus with hyperglycemia: Secondary | ICD-10-CM

## 2023-02-28 MED ORDER — SEMAGLUTIDE(0.25 OR 0.5MG/DOS) 2 MG/3ML ~~LOC~~ SOPN: 0.2500 mg | PEN_INJECTOR | SUBCUTANEOUS | 0 refills | Status: DC

## 2023-02-28 NOTE — Telephone Encounter (Signed)
Rx sent to [facility]

## 2023-02-28 NOTE — Telephone Encounter (Signed)
Patient called stating [name] is charging her $600 for her ozempic but she found out [facility] is only going to charge $11. Please send medication to [facility] @ [address] [place], [place], [place] [address]

## 2023-03-04 NOTE — Progress Notes (Deleted)
    [name] [address] [place] [address] Phone: [phone]   Assessment and Plan:     There are no diagnoses linked to this encounter.  ***   Pertinent previous records reviewed include ***   Follow Up: ***     Subjective:   I, [name], am serving as a Neurosurgeon for Doctor [employer]  Chief Complaint: bilat arm and wrist pain   HPI:   03/05/2023 Patient is a 67 year old female complaining of bilat and wrist pain. Patient states  Relevant Historical Information: ***  Additional pertinent review of systems negative.   Current Outpatient Medications:    ALPRAZolam (XANAX) 0.5 MG tablet, Take 1 tablet (0.5 mg total) by mouth 2 (two) times daily as needed for anxiety., Disp: 20 tablet, Rfl: 0   amLODipine (NORVASC) 10 MG tablet, Take 1 tablet (10 mg total) by mouth daily., Disp: 90 tablet, Rfl: 1   atorvastatin (LIPITOR) 80 MG tablet, Take 1 tablet (80 mg total) by mouth daily., Disp: 90 tablet, Rfl: 1   Cholecalciferol 125 MCG (5000 UT) capsule, Take by mouth., Disp: , Rfl:    clobetasol cream (TEMOVATE) 0.05 %, Apply 1 application. topically every 3 (three) days., Disp: , Rfl:    DULoxetine (CYMBALTA) 30 MG capsule, Take 1 capsule (30 mg total) by mouth daily., Disp: 90 capsule, Rfl: 3   ezetimibe (ZETIA) 10 MG tablet, Take 1 tablet (10 mg total) by mouth daily., Disp: 90 tablet, Rfl: 1   ferrous sulfate 325 (65 FE) MG tablet, Take 325 mg by mouth daily with breakfast., Disp: , Rfl:    fluticasone (FLONASE) 50 MCG/ACT nasal spray, Place 2 sprays into the nose as needed., Disp: , Rfl:    losartan (COZAAR) 100 MG tablet, Take 1 tablet (100 mg total) by mouth daily., Disp: 90 tablet, Rfl: 1   metFORMIN (GLUCOPHAGE) 500 MG tablet, Take 1 tablet (500 mg total) by mouth daily with breakfast., Disp: 90 tablet, Rfl: 1   methocarbamol (ROBAXIN) 500 MG tablet, Take 1 tablet (500 mg total) by mouth every 6 (six)  hours as needed., Disp: 90 tablet, Rfl: 1   Multiple Vitamins-Minerals (MULTIVITAMIN PO), Take 1 tablet by mouth daily., Disp: , Rfl:    Probiotic Product (PROBIOTIC DAILY PO), Take 1 capsule by mouth daily., Disp: , Rfl:    Semaglutide,0.25 or 0.5MG /DOS, 2 MG/3ML SOPN, Inject 0.25 mg into the skin once a week., Disp: 3 mL, Rfl: 0   spironolactone (ALDACTONE) 25 MG tablet, Take 1 tablet (25 mg total) by mouth daily., Disp: 90 tablet, Rfl: 1   Turmeric 500 MG CAPS, Take 500 mg by mouth daily., Disp: , Rfl:    valACYclovir (VALTREX) 1000 MG tablet, Take 1,000 mg by mouth daily as needed (cold sores)., Disp: , Rfl:    Objective:     There were no vitals filed for this visit.    There is no height or weight on file to calculate BMI.    Physical Exam:    ***   Electronically signed by:  [name] 4:23 PM 03/04/23

## 2023-03-05 ENCOUNTER — Ambulatory Visit: Payer: Medicare Other | Admitting: Sports Medicine

## 2023-03-12 ENCOUNTER — Other Ambulatory Visit: Payer: Self-pay

## 2023-03-13 NOTE — Progress Notes (Signed)
[name] [facility] [address] [place] [address] Phone: [phone]   Assessment and Plan:    1. Bilateral hand pain -Chronic with exacerbation, initial sports medicine visit - Patient presents with chronic pain in bilateral upper extremities, most prominent in left hand/wrist/distal forearm, worse on left compared to right.  I suspect patient likely has underlying degenerative changes at first MCP and CMC joints that are contributing to pain, and left-sided DeQuervain's Tenosynovitis based on HPI and physical exam.  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  Do not to use additional NSAIDs while taking meloxicam.  May use Tylenol [phone] mg 2 to 3 times a day for breakthrough pain.  -May use Tylenol, warm hand baths, Voltaren gel topically over areas of pain for chronic hand pain - Start HEP for wrist  Pertinent previous records reviewed include none   Follow Up: 4 weeks for reevaluation.  Would x-ray areas of remaining pain and could consider physical therapy versus CSI   Subjective:   I, [name], am serving as a Neurosurgeon for Doctor [employer]  Chief Complaint: bilat arm and wrist pain   HPI:   03/14/2023 Patient is a 67 year old female complaining of bilat and wrist pain. Patient states that she was told she has tendinitis in her arms, shoulder and wrist has popping and clicking. Pain runs up and down both arms. Patient does massages and acupuncture. Does have neck issues that are controlled with Cymbalta.   Relevant Historical Information: Hypertension, GERD, DM type II  Additional pertinent review of systems negative.   Current Outpatient Medications:    ALPRAZolam (XANAX) 0.5 MG tablet, Take 1 tablet (0.5 mg total) by mouth 2 (two) times daily as needed for anxiety., Disp: 20 tablet, Rfl: 0   amLODipine  (NORVASC) 10 MG tablet, Take 1 tablet (10 mg total) by mouth daily., Disp: 90 tablet, Rfl: 1   atorvastatin (LIPITOR) 80 MG tablet, Take 1 tablet (80 mg total) by mouth daily., Disp: 90 tablet, Rfl: 1   Cholecalciferol 125 MCG (5000 UT) capsule, Take by mouth., Disp: , Rfl:    clobetasol cream (TEMOVATE) 0.05 %, Apply 1 application. topically every 3 (three) days., Disp: , Rfl:    DULoxetine (CYMBALTA) 30 MG capsule, Take 1 capsule (30 mg total) by mouth daily., Disp: 90 capsule, Rfl: 3   ezetimibe (ZETIA) 10 MG tablet, Take 1 tablet (10 mg total) by mouth daily., Disp: 90 tablet, Rfl: 1   ferrous sulfate 325 (65 FE) MG tablet, Take 325 mg by mouth daily with breakfast., Disp: , Rfl:    fluticasone (FLONASE) 50 MCG/ACT nasal spray, Place 2 sprays into the nose as needed., Disp: , Rfl:    losartan (COZAAR) 100 MG tablet, Take 1 tablet (100 mg total) by mouth daily., Disp: 90 tablet, Rfl: 1   meloxicam (MOBIC) 15 MG tablet, Take 1 tablet (15 mg total) by mouth daily., Disp: 21 tablet, Rfl: 0   metFORMIN (GLUCOPHAGE) 500 MG tablet, Take 1 tablet (500 mg total) by mouth daily with breakfast., Disp: 90 tablet, Rfl: 1   methocarbamol (ROBAXIN) 500 MG tablet, Take 1 tablet (500 mg total) by mouth every 6 (six) hours as needed., Disp: 90 tablet, Rfl: 1   Multiple Vitamins-Minerals (MULTIVITAMIN PO), Take 1 tablet by mouth daily., Disp: , Rfl:    Probiotic Product (PROBIOTIC DAILY PO),  Take 1 capsule by mouth daily., Disp: , Rfl:    Semaglutide,0.25 or 0.5MG /DOS, 2 MG/3ML SOPN, Inject 0.25 mg into the skin once a week., Disp: 3 mL, Rfl: 0   spironolactone (ALDACTONE) 25 MG tablet, Take 1 tablet (25 mg total) by mouth daily., Disp: 90 tablet, Rfl: 1   Turmeric 500 MG CAPS, Take 500 mg by mouth daily., Disp: , Rfl:    valACYclovir (VALTREX) 1000 MG tablet, Take 1,000 mg by mouth daily as needed (cold sores)., Disp: , Rfl:    Objective:     Vitals:   03/14/23 1458  BP: 138/86  Pulse: 76  SpO2: 97%   Weight: 224 lb (101.6 kg)  Height: 5\' 5"  (1.651 m)      Body mass index is 37.28 kg/m.    Physical Exam:    General: Appears well, nad, nontoxic and pleasant Neuro:sensation intact, strength is 5/5 in upper extremities, muscle tone wnl Skin:no susupicious lesions or rashes  Bilateral wrist:   No deformity Mild edema around the left CMC on the left ROM  Ext 90, flexion70, radial/ulnar deviation 30 TTP first MCP and CMC, worse on left nttp over the snuff box, dorsal carpals, volar carpals, radial styloid, ulnar styloid,   tfcc Negative Tinel's Positive Finkelstein on left, negative finklestein on the right Neg tfcc bounce test No pain with resisted wrist flexion, extension, supination, pronation Full ROM neck, shoulders  Electronically signed by:  [name] [facility] 3:28 PM 03/14/23

## 2023-03-14 ENCOUNTER — Ambulatory Visit (INDEPENDENT_AMBULATORY_CARE_PROVIDER_SITE_OTHER): Payer: Medicare Other | Admitting: Sports Medicine

## 2023-03-14 VITALS — BP 138/86 | HR 76 | Ht 65.0 in | Wt 224.0 lb

## 2023-03-14 DIAGNOSIS — M79641 Pain in right hand: Secondary | ICD-10-CM

## 2023-03-14 DIAGNOSIS — M79642 Pain in left hand: Secondary | ICD-10-CM | POA: Diagnosis not present

## 2023-03-14 MED ORDER — MELOXICAM 15 MG PO TABS: 15.0000 mg | ORAL_TABLET | Freq: Every day | ORAL | 0 refills | Status: DC

## 2023-03-14 NOTE — Patient Instructions (Addendum)
Good to see you  - Start meloxicam 15 mg daily x3 weeks. Do not to use additional NSAIDs while taking meloxicam.  May use Tylenol [phone] mg 2 to 3 times a day for breakthrough pain.  Wrist exercises given Chronic hand pain use topical Voltaren gel warm, hand baths, and tylenol Follow up in 4 weeks

## 2023-04-10 NOTE — Progress Notes (Unsigned)
[name] [facility] [address] [place] [address] Phone: [phone]   Assessment and Plan:     There are no diagnoses linked to this encounter.  ***   Pertinent previous records reviewed include ***    Follow Up: ***     Subjective:   I, [name], am serving as a Neurosurgeon for Doctor [employer]   Chief Complaint: bilat arm and wrist pain    HPI:    03/14/2023 Patient is a 67 year old female complaining of bilat and wrist pain. Patient states that she was told she has tendinitis in her arms, shoulder and wrist has popping and clicking. Pain runs up and down both arms. Patient does massages and acupuncture. Does have neck issues that are controlled with Cymbalta.   04/11/2023 Patient states   Relevant Historical Information: Hypertension, GERD, DM type II  Additional pertinent review of systems negative.   Current Outpatient Medications:    ALPRAZolam (XANAX) 0.5 MG tablet, Take 1 tablet (0.5 mg total) by mouth 2 (two) times daily as needed for anxiety., Disp: 20 tablet, Rfl: 0   amLODipine (NORVASC) 10 MG tablet, Take 1 tablet (10 mg total) by mouth daily., Disp: 90 tablet, Rfl: 1   atorvastatin (LIPITOR) 80 MG tablet, Take 1 tablet (80 mg total) by mouth daily., Disp: 90 tablet, Rfl: 1   Cholecalciferol 125 MCG (5000 UT) capsule, Take by mouth., Disp: , Rfl:    clobetasol cream (TEMOVATE) 0.05 %, Apply 1 application. topically every 3 (three) days., Disp: , Rfl:    DULoxetine (CYMBALTA) 30 MG capsule, Take 1 capsule (30 mg total) by mouth daily., Disp: 90 capsule, Rfl: 3   ezetimibe (ZETIA) 10 MG tablet, Take 1 tablet (10 mg total) by mouth daily., Disp: 90 tablet, Rfl: 1   ferrous sulfate 325 (65 FE) MG tablet, Take 325 mg by mouth daily with breakfast., Disp: , Rfl:    fluticasone (FLONASE) 50 MCG/ACT nasal spray, Place 2 sprays into the nose as needed., Disp: , Rfl:    losartan (COZAAR) 100 MG tablet, Take 1  tablet (100 mg total) by mouth daily., Disp: 90 tablet, Rfl: 1   meloxicam (MOBIC) 15 MG tablet, Take 1 tablet (15 mg total) by mouth daily., Disp: 21 tablet, Rfl: 0   metFORMIN (GLUCOPHAGE) 500 MG tablet, Take 1 tablet (500 mg total) by mouth daily with breakfast., Disp: 90 tablet, Rfl: 1   methocarbamol (ROBAXIN) 500 MG tablet, Take 1 tablet (500 mg total) by mouth every 6 (six) hours as needed., Disp: 90 tablet, Rfl: 1   Multiple Vitamins-Minerals (MULTIVITAMIN PO), Take 1 tablet by mouth daily., Disp: , Rfl:    Probiotic Product (PROBIOTIC DAILY PO), Take 1 capsule by mouth daily., Disp: , Rfl:    Semaglutide,0.25 or 0.5MG /DOS, 2 MG/3ML SOPN, Inject 0.25 mg into the skin once a week., Disp: 3 mL, Rfl: 0   spironolactone (ALDACTONE) 25 MG tablet, Take 1 tablet (25 mg total) by mouth daily., Disp: 90 tablet, Rfl: 1   Turmeric 500 MG CAPS, Take 500 mg by mouth daily., Disp: , Rfl:    valACYclovir (VALTREX) 1000 MG tablet, Take 1,000 mg by mouth daily as needed (cold sores)., Disp: , Rfl:    Objective:     There were no vitals filed for this visit.    There is no height or weight on file to calculate BMI.    Physical Exam:    ***  Electronically signed by:  [name] 7:33 AM 04/10/23

## 2023-04-11 ENCOUNTER — Other Ambulatory Visit: Payer: Self-pay | Admitting: Sports Medicine

## 2023-04-11 ENCOUNTER — Telehealth: Payer: Self-pay | Admitting: Sports Medicine

## 2023-04-11 ENCOUNTER — Ambulatory Visit: Payer: Medicare Other | Admitting: Sports Medicine

## 2023-04-11 MED ORDER — MELOXICAM 15 MG PO TABS: 15.0000 mg | ORAL_TABLET | Freq: Every day | ORAL | 0 refills | Status: DC | PRN

## 2023-04-11 NOTE — Progress Notes (Unsigned)
Meloxicam refill 

## 2023-04-11 NOTE — Telephone Encounter (Signed)
Pt notified of refill

## 2023-04-11 NOTE — Telephone Encounter (Signed)
Pt cancelled today's appt as she is sick, rescheduled for next Friday. Finished Meloxicam Monday and would like refill if possible to [facility].

## 2023-04-11 NOTE — Telephone Encounter (Signed)
Refill placed

## 2023-04-17 NOTE — Progress Notes (Unsigned)
[name] [address] [place] [address] Phone: [phone]   Assessment and Plan:     There are no diagnoses linked to this encounter.  ***   Pertinent previous records reviewed include ***    Follow Up: ***     Subjective:   I, [name], am serving as a Neurosurgeon for Doctor [employer]   Chief Complaint: bilat arm and wrist pain    HPI:    03/14/2023 Patient is a 67 year old female complaining of bilat and wrist pain. Patient states that she was told she has tendinitis in her arms, shoulder and wrist has popping and clicking. Pain runs up and down both arms. Patient does massages and acupuncture. Does have neck issues that are controlled with Cymbalta.   04/18/2023 Patient states   Relevant Historical Information: Hypertension, GERD, DM type II  Additional pertinent review of systems negative.   Current Outpatient Medications:    ALPRAZolam (XANAX) 0.5 MG tablet, Take 1 tablet (0.5 mg total) by mouth 2 (two) times daily as needed for anxiety., Disp: 20 tablet, Rfl: 0   amLODipine (NORVASC) 10 MG tablet, Take 1 tablet (10 mg total) by mouth daily., Disp: 90 tablet, Rfl: 1   atorvastatin (LIPITOR) 80 MG tablet, Take 1 tablet (80 mg total) by mouth daily., Disp: 90 tablet, Rfl: 1   Cholecalciferol 125 MCG (5000 UT) capsule, Take by mouth., Disp: , Rfl:    clobetasol cream (TEMOVATE) 0.05 %, Apply 1 application. topically every 3 (three) days., Disp: , Rfl:    DULoxetine (CYMBALTA) 30 MG capsule, Take 1 capsule (30 mg total) by mouth daily., Disp: 90 capsule, Rfl: 3   ezetimibe (ZETIA) 10 MG tablet, Take 1 tablet (10 mg total) by mouth daily., Disp: 90 tablet, Rfl: 1   ferrous sulfate 325 (65 FE) MG tablet, Take 325 mg by mouth daily with breakfast., Disp: , Rfl:    fluticasone (FLONASE) 50 MCG/ACT nasal spray, Place 2 sprays into the nose as needed., Disp: , Rfl:    losartan (COZAAR) 100 MG tablet, Take 1  tablet (100 mg total) by mouth daily., Disp: 90 tablet, Rfl: 1   meloxicam (MOBIC) 15 MG tablet, Take 1 tablet (15 mg total) by mouth daily as needed for pain., Disp: 21 tablet, Rfl: 0   metFORMIN (GLUCOPHAGE) 500 MG tablet, Take 1 tablet (500 mg total) by mouth daily with breakfast., Disp: 90 tablet, Rfl: 1   methocarbamol (ROBAXIN) 500 MG tablet, Take 1 tablet (500 mg total) by mouth every 6 (six) hours as needed., Disp: 90 tablet, Rfl: 1   Multiple Vitamins-Minerals (MULTIVITAMIN PO), Take 1 tablet by mouth daily., Disp: , Rfl:    Probiotic Product (PROBIOTIC DAILY PO), Take 1 capsule by mouth daily., Disp: , Rfl:    Semaglutide,0.25 or 0.5MG /DOS, 2 MG/3ML SOPN, Inject 0.25 mg into the skin once a week., Disp: 3 mL, Rfl: 0   spironolactone (ALDACTONE) 25 MG tablet, Take 1 tablet (25 mg total) by mouth daily., Disp: 90 tablet, Rfl: 1   Turmeric 500 MG CAPS, Take 500 mg by mouth daily., Disp: , Rfl:    valACYclovir (VALTREX) 1000 MG tablet, Take 1,000 mg by mouth daily as needed (cold sores)., Disp: , Rfl:    Objective:     There were no vitals filed for this visit.    There is no height or weight on file to calculate BMI.    Physical Exam:    ***  Electronically signed by:  [name] 7:34 AM 04/17/23

## 2023-04-18 ENCOUNTER — Ambulatory Visit: Payer: Medicare Other | Admitting: Sports Medicine

## 2023-05-09 ENCOUNTER — Other Ambulatory Visit: Payer: Self-pay | Admitting: Family Medicine

## 2023-05-14 ENCOUNTER — Other Ambulatory Visit: Payer: Self-pay | Admitting: *Deleted

## 2023-05-14 NOTE — Telephone Encounter (Signed)
 Left message to return call to office.

## 2023-05-14 NOTE — Telephone Encounter (Signed)
 Fax from pharmacy requesting refill. Please advise. This office has not refilled before.

## 2023-05-15 MED ORDER — CLOBETASOL PROPIONATE 0.05 % EX CREA: 1.0000 | TOPICAL_CREAM | CUTANEOUS | 1 refills | Status: DC

## 2023-06-02 ENCOUNTER — Ambulatory Visit (INDEPENDENT_AMBULATORY_CARE_PROVIDER_SITE_OTHER): Payer: Medicare Other | Admitting: Family Medicine

## 2023-06-02 ENCOUNTER — Encounter: Payer: Self-pay | Admitting: Family Medicine

## 2023-06-02 VITALS — BP 144/84 | HR 88 | Temp 97.9°F | Resp 18 | Ht 65.0 in

## 2023-06-02 DIAGNOSIS — E1165 Type 2 diabetes mellitus with hyperglycemia: Secondary | ICD-10-CM

## 2023-06-02 DIAGNOSIS — M4802 Spinal stenosis, cervical region: Secondary | ICD-10-CM | POA: Diagnosis not present

## 2023-06-02 DIAGNOSIS — I10 Essential (primary) hypertension: Secondary | ICD-10-CM | POA: Diagnosis not present

## 2023-06-02 DIAGNOSIS — E782 Mixed hyperlipidemia: Secondary | ICD-10-CM

## 2023-06-02 DIAGNOSIS — F4321 Adjustment disorder with depressed mood: Secondary | ICD-10-CM | POA: Insufficient documentation

## 2023-06-02 DIAGNOSIS — R053 Chronic cough: Secondary | ICD-10-CM

## 2023-06-02 MED ORDER — EZETIMIBE 10 MG PO TABS: 10.0000 mg | ORAL_TABLET | Freq: Every day | ORAL | 1 refills | Status: DC

## 2023-06-02 MED ORDER — SEMAGLUTIDE(0.25 OR 0.5MG/DOS) 2 MG/3ML ~~LOC~~ SOPN: 0.2500 mg | PEN_INJECTOR | SUBCUTANEOUS | 0 refills | Status: DC

## 2023-06-02 MED ORDER — SPIRONOLACTONE 25 MG PO TABS: 25.0000 mg | ORAL_TABLET | Freq: Every day | ORAL | 1 refills | Status: DC

## 2023-06-02 MED ORDER — DULOXETINE HCL 60 MG PO CPEP: 60.0000 mg | ORAL_CAPSULE | Freq: Every day | ORAL | 1 refills | Status: DC

## 2023-06-02 MED ORDER — ATORVASTATIN CALCIUM 80 MG PO TABS: 80.0000 mg | ORAL_TABLET | Freq: Every day | ORAL | 1 refills | Status: DC

## 2023-06-02 MED ORDER — VALSARTAN 320 MG PO TABS: 320.0000 mg | ORAL_TABLET | Freq: Every day | ORAL | 3 refills | Status: DC

## 2023-06-02 MED ORDER — AMLODIPINE BESYLATE 10 MG PO TABS: 10.0000 mg | ORAL_TABLET | Freq: Every day | ORAL | 1 refills | Status: DC

## 2023-06-02 NOTE — Assessment & Plan Note (Signed)
Chronic.  Has a lot of pain, however better controlled when on Cymbalta 30 mg.  Also, saw spine specialist and on meloxicam 7.5mg  every other day

## 2023-06-02 NOTE — Assessment & Plan Note (Signed)
Chronic.  Not well-controlled.  LDL is 138, above the target of less than 100 for individuals with diabetes and hypertension. On atorvastatin 80 mg and previously stopped Zetia. Plan to restart Zetia to achieve target LDL levels while continuing atorvastatin.Marland Kitchen

## 2023-06-02 NOTE — Assessment & Plan Note (Signed)
Chronic.  Not Controlled   Continue amlodipine 10 mg daily  and spironolactone 25 mg daily.  Change losartan to valsartan 320mg  daily  monitor.   goal less than 130/80 due to ascending thoracic aneurysm.  She has a new patient appoint with cardiology in February.

## 2023-06-02 NOTE — Progress Notes (Signed)
Subjective:     Patient ID: [name], female    DOB: 05/18/1955, 68 y.o.   MRN: [id number]  Chief Complaint  Patient presents with   Medical Management of Chronic Issues    3 month follow-up Dry non productive cough that won't go away, maybe from a medication    HPI-late HTN - Pt is on  losartan 100 mg, amlodipine 10mg  daily, and spironolactone 25mg  .Bp's running 160/80's-uses different cuffs. No ha/dizziness/cp/palp/edema/cough/sob.  not exercising as much.  Back issues DM Type 2 - Pt is on Metformin 500 mg daily. Pt states that Mounjaro 2.5 mg injections weekly was too expensive, and discontinued Ozmepic due to constipation and nausea. Ozempic-too expensive again so not restarted since last ov 3 mo ago.  Sugars are running 120-140. Just can't seem to lose wt.  A1C  6.8 in August HLD-mixed.  On lipitor 80.  stopped zetia-just thought could work on diet and that levels "aren't that bad".  Intolerant of crestor Pain back, arms, hands-saw sports med-meloxicam helps.  Saw spine doc in [place] for LUQ pain from back to abd.  Some scoliosis. Taking meloxicam 7.5mg  every other day-helps.  Depression-taking cymbalta 30mg  daily now for OA No SI Cough-long time-seems more in winter-drip in throat.  Colonoscopy 5 yrs ago -always normal  There are no preventive care reminders to display for this patient.   Past Medical History:  Diagnosis Date   Anemia    Anxiety    Arthritis    Diabetes mellitus without complication (HCC)    Headache    Hyperlipemia    Hypertension    Lung nodule    Scoliosis    Thoracic aortic aneurysm [facility])     Past Surgical History:  Procedure Laterality Date   ANKLE SURGERY Right    BREAST BIOPSY     CESAREAN SECTION     x3   CHOLECYSTECTOMY, LAPAROSCOPIC     ESOPHAGOGASTRODUODENOSCOPY     HEMORRHOID SURGERY     PARTIAL KNEE ARTHROPLASTY Right 10/09/2021   Procedure: UNICOMPARTMENTAL KNEE;  Surgeon: Teryl Lucy, MD;  Location: [facility];  Service:  Orthopedics;  Laterality: Right;   TOTAL KNEE ARTHROPLASTY Left 12/04/2012         Current Outpatient Medications:    ALPRAZolam (XANAX) 0.5 MG tablet, Take 1 tablet (0.5 mg total) by mouth 2 (two) times daily as needed for anxiety., Disp: 20 tablet, Rfl: 0   Cholecalciferol 125 MCG (5000 UT) capsule, Take by mouth., Disp: , Rfl:    clobetasol cream (TEMOVATE) 0.05 %, Apply 1 Application topically every 3 (three) days., Disp: 60 g, Rfl: 1   ferrous sulfate 325 (65 FE) MG tablet, Take 325 mg by mouth daily with breakfast., Disp: , Rfl:    fluticasone (FLONASE) 50 MCG/ACT nasal spray, Place 2 sprays into the nose as needed., Disp: , Rfl:    meloxicam (MOBIC) 7.5 MG tablet, Take 7.5 mg by mouth daily., Disp: , Rfl:    metFORMIN (GLUCOPHAGE) 500 MG tablet, TAKE 1 TABLET BY MOUTH DAILY WITH BREAKFAST, Disp: 90 tablet, Rfl: 1   methocarbamol (ROBAXIN) 500 MG tablet, Take 1 tablet (500 mg total) by mouth every 6 (six) hours as needed., Disp: 90 tablet, Rfl: 1   Multiple Vitamins-Minerals (MULTIVITAMIN PO), Take 1 tablet by mouth daily., Disp: , Rfl:    Probiotic Product (PROBIOTIC DAILY PO), Take 1 capsule by mouth daily., Disp: , Rfl:    Turmeric 500 MG CAPS, Take 500 mg by mouth daily., Disp: ,  Rfl:    valACYclovir (VALTREX) 1000 MG tablet, Take 1,000 mg by mouth daily as needed (cold sores)., Disp: , Rfl:    valsartan (DIOVAN) 320 MG tablet, Take 1 tablet (320 mg total) by mouth daily., Disp: 90 tablet, Rfl: 3   amLODipine (NORVASC) 10 MG tablet, Take 1 tablet (10 mg total) by mouth daily., Disp: 90 tablet, Rfl: 1   atorvastatin (LIPITOR) 80 MG tablet, Take 1 tablet (80 mg total) by mouth daily., Disp: 90 tablet, Rfl: 1   DULoxetine (CYMBALTA) 60 MG capsule, Take 1 capsule (60 mg total) by mouth daily., Disp: 90 capsule, Rfl: 1   ezetimibe (ZETIA) 10 MG tablet, Take 1 tablet (10 mg total) by mouth daily., Disp: 90 tablet, Rfl: 1   Semaglutide,0.25 or 0.5MG /DOS, 2 MG/3ML SOPN, Inject 0.25 mg into  the skin once a week., Disp: 3 mL, Rfl: 0   spironolactone (ALDACTONE) 25 MG tablet, Take 1 tablet (25 mg total) by mouth daily., Disp: 90 tablet, Rfl: 1  Allergies  Allergen Reactions   Ace Inhibitors Cough    Other Reaction(s): cough, Other (See Comments)  other   Latex     rash   Penicillins     Vaginal Infection    Rosuvastatin Other (See Comments)    Leg cramps  Other Reaction(s): Other (See Comments)  Other   ROS neg/noncontributory except as noted HPI/below      Objective:     BP (!) 144/84 (BP Location: Left Arm, Patient Position: Sitting, Cuff Size: Large)   Pulse 88   Temp 97.9 F (36.6 C) (Temporal)   Resp 18   Ht 5\' 5"  (1.651 m)   LMP 05/06/2002   SpO2 96%   BMI 37.28 kg/m  Wt Readings from Last 3 Encounters:  03/14/23 224 lb (101.6 kg)  02/25/23 220 lb 8 oz (100 kg)  11/11/22 217 lb 8 oz (98.7 kg)    Physical Exam   Gen: WDWN NAD HEENT: NCAT, conjunctiva not injected, sclera nonicteric NECK:  supple, no thyromegaly, no nodes, no carotid bruits CARDIAC: RRR, S1S2+, no murmur. DP 2+B LUNGS: CTAB. No wheezes ABDOMEN:  BS+, soft, NTND, No HSM, no masses EXT:  no edema MSK: no gross abnormalities.  NEURO: A&O x3.  CN II-XII intact.  PSYCH: normal mood. Good eye contact  spent w/pt updating history, listening to concerns, discussing plan and educating pt    Assessment & Plan:  Type 2 diabetes mellitus with hyperglycemia, without long-term current use of insulin (HCC) Assessment & Plan: Chronic.  Controlled.  Continue metformin 500 mg daily.  Work on diet/exercise.  She is willing to go back on Ozempic to try to assist with weight loss.   Cost of Greggory Keen was prohibitive.  Start Ozempic 0.25 mg weekly and call monthly for increase  Orders: -     Semaglutide(0.25 or 0.5MG /DOS); Inject 0.25 mg into the skin once a week.  Dispense: 3 mL; Refill: 0 -     Comprehensive metabolic panel -     Hemoglobin A1c -     TSH  Mixed  hyperlipidemia Assessment & Plan: Chronic.  Not well-controlled.  LDL is 138, above the target of less than 100 for individuals with diabetes and hypertension. On atorvastatin 80 mg and previously stopped Zetia. Plan to restart Zetia to achieve target LDL levels while continuing atorvastatin..  Orders: -     Atorvastatin Calcium; Take 1 tablet (80 mg total) by mouth daily.  Dispense: 90 tablet; Refill: 1 -  Ezetimibe; Take 1 tablet (10 mg total) by mouth daily.  Dispense: 90 tablet; Refill: 1 -     Comprehensive metabolic panel -     Lipid panel -     TSH  Essential hypertension Assessment & Plan: Chronic.  Not Controlled   Continue amlodipine 10 mg daily  and spironolactone 25 mg daily.  Change losartan to valsartan 320mg  daily  monitor.   goal less than 130/80 due to ascending thoracic aneurysm.  She has a new patient appoint with cardiology in February.  Orders: -     Valsartan; Take 1 tablet (320 mg total) by mouth daily.  Dispense: 90 tablet; Refill: 3 -     amLODIPine Besylate; Take 1 tablet (10 mg total) by mouth daily.  Dispense: 90 tablet; Refill: 1 -     Spironolactone; Take 1 tablet (25 mg total) by mouth daily.  Dispense: 90 tablet; Refill: 1 -     CBC with Differential/Platelet -     Comprehensive metabolic panel -     TSH  Spinal stenosis of cervical region Assessment & Plan: Chronic.  Has a lot of pain, however better controlled when on Cymbalta 30 mg.  Also, saw spine specialist and on meloxicam 7.5mg  every other day   Orders: -     DULoxetine HCl; Take 1 capsule (60 mg total) by mouth daily.  Dispense: 90 capsule; Refill: 1  Adjustment disorder with depressed mood Assessment & Plan: Chronic.  But worsening d/t health/pain.  Will increase cymbalta to 60mg  daily   Chronic cough  Dry cough worsens at night, possibly related to postnasal drip. Previous ENT evaluation ruled out reflux. Humidifier and lozenges provide some relief. Monitor symptoms and consider  referral to ENT if symptoms persist.  Return in about 3 months (around 08/31/2023) for chronic follow-up.  Angelena Sole, MD

## 2023-06-02 NOTE — Patient Instructions (Addendum)
It was very nice to see you today!  Chang losartan to valsartan and monitor bp's Increasing duloxatine to 60mg    PLEASE NOTE:  If you had any lab tests please let us know if you have not heard back within a few days. You may see your results on MyChart before we have a chance to review them but we will give you a call once they are reviewed by [place]. If we ordered any referrals today, please let us know if you have not heard from their office within the next week.   Please try these tips to maintain a healthy lifestyle:  Eat most of your calories during the day when you are active. Eliminate processed foods including packaged sweets (pies, cakes, cookies), reduce intake of potatoes, white bread, white pasta, and white rice. Look for whole grain options, oat flour or almond flour.  Each meal should contain half fruits/vegetables, one quarter protein, and one quarter carbs (no bigger than a computer mouse).  Cut down on sweet beverages. This includes juice, soda, and sweet tea. Also watch fruit intake, though this is a healthier sweet option, it still contains natural sugar! Limit to 3 servings daily.  Drink at least 1 glass of water with each meal and aim for at least 8 glasses per day  Exercise at least 150 minutes every week.

## 2023-06-02 NOTE — Assessment & Plan Note (Signed)
Chronic.  Controlled.  Continue metformin 500 mg daily.  Work on diet/exercise.  She is willing to go back on Ozempic to try to assist with weight loss.   Cost of [name] was prohibitive.  Start Ozempic 0.25 mg weekly and call monthly for increase

## 2023-06-02 NOTE — Assessment & Plan Note (Signed)
Chronic.  But worsening d/t health/pain.  Will increase cymbalta to 60mg  daily

## 2023-06-16 ENCOUNTER — Encounter (HOSPITAL_BASED_OUTPATIENT_CLINIC_OR_DEPARTMENT_OTHER): Payer: Self-pay | Admitting: Cardiovascular Disease

## 2023-06-16 ENCOUNTER — Ambulatory Visit (HOSPITAL_BASED_OUTPATIENT_CLINIC_OR_DEPARTMENT_OTHER): Payer: Medicare Other | Admitting: Cardiovascular Disease

## 2023-06-16 VITALS — BP 132/82 | HR 84 | Ht 64.0 in | Wt 220.3 lb

## 2023-06-16 DIAGNOSIS — I10 Essential (primary) hypertension: Secondary | ICD-10-CM | POA: Diagnosis not present

## 2023-06-16 DIAGNOSIS — E1169 Type 2 diabetes mellitus with other specified complication: Secondary | ICD-10-CM | POA: Diagnosis not present

## 2023-06-16 DIAGNOSIS — I7121 Aneurysm of the ascending aorta, without rupture: Secondary | ICD-10-CM

## 2023-06-16 DIAGNOSIS — E785 Hyperlipidemia, unspecified: Secondary | ICD-10-CM

## 2023-06-16 MED ORDER — METOPROLOL SUCCINATE ER 25 MG PO TB24: 25.0000 mg | ORAL_TABLET | Freq: Every day | ORAL | 1 refills | Status: DC

## 2023-06-16 MED ORDER — AMLODIPINE BESYLATE-VALSARTAN 10-320 MG PO TABS: 1.0000 | ORAL_TABLET | Freq: Every day | ORAL | 3 refills | Status: AC

## 2023-06-16 NOTE — Patient Instructions (Addendum)
 Medication Instructions:  START METOPROLOL  25 MG DAILY   WHEN YOU FINISH YOUR CURRENT PRESCRIPTION START OF VALSARTAN  AND AMLODIPINE  AND START AMLODIPINE -VALSARTAN  10-320 MG COMBINATION PILL ONE DAILY   *If you need a refill on your cardiac medications before your next appointment, please call your pharmacy*  Lab Work: NONE   Testing/Procedures: Your physician has requested that you have an echocardiogram. Echocardiography is a painless test that uses sound waves to create images of your heart. It provides your doctor with information about the size and shape of your heart and how well your heart's chambers and valves are working. This procedure takes approximately one hour. There are no restrictions for this procedure. Please do NOT wear cologne, perfume, aftershave, or lotions (deodorant is allowed). Please arrive 15 minutes prior to your appointment time.  Please note: We ask at that you not bring children with you during ultrasound (echo/ vascular) testing. Due to room size and safety concerns, children are not allowed in the ultrasound rooms during exams. Our front office staff cannot provide observation of children in our lobby area while testing is being conducted. An adult accompanying a patient to their appointment will only be allowed in the ultrasound room at the discretion of the ultrasound technician under special circumstances. We apologize for any inconvenience.  TO BE DONE IN 4 MONTHS   Follow-Up: At [facility], you and your health needs are our priority.  As part of our continuing mission to provide you with exceptional heart care, we have created designated Provider Care Teams.  These Care Teams include your primary Cardiologist (physician) and Advanced Practice Providers (APPs -  Physician Assistants and Nurse Practitioners) who all work together to provide you with the care you need, when you need it.  We recommend signing up for the patient portal called  "MyChart".  Sign up information is provided on this After Visit Summary.  MyChart is used to connect with patients for Virtual Visits (Telemedicine).  Patients are able to view lab/test results, encounter notes, upcoming appointments, etc.  Non-urgent messages can be sent to your provider as well.   To learn more about what you can do with MyChart, go to [email or web address].    Your next appointment:   AFTER ECHO IN 4 MONTHS WITH EITHER DR Altamont OR CAITLIN W NP

## 2023-06-16 NOTE — Progress Notes (Signed)
 Cardiology Office Note:  .   Date:  06/28/2023  ID:  [name], DOB [birth year], MRN [id number] PCP: Jeani Sow, MD  [facility] Providers Cardiologist:  None    History of Present Illness: [name] is a 68 y.o. female with hypertension, hyperlipidemia, ascending aortic aneurysm, diabetes, and obesity here to establish care.  She has been working with her primary care provider and struggled with blood pressure controlled.  At her last visit 06/02/2023 losartan was switched to valsartan.  Ms. Doane has a 4.3 cm aortic aneurysm discovered incidentally during imaging for diverticulitis symptoms. She seeks guidance on activity limitations and management strategies. She has no chest pain, pressure, or breathing issues during exercise, and her heart rate can reach 135-140 bpm without difficulty. Family history of heart disease includes her father who died of a heart attack at 79 and her brother who underwent quadruple bypass surgery.  She monitors her blood pressure at home, noting systolic readings in the 130s-140s and diastolic readings in the 70s-90s. Currently on valsartan and amlodipine for hypertension. She has a history of 'white coat syndrome' affecting her blood pressure readings in clinical settings. No chest pain, pressure, breathing issues during exercise, swelling in legs or feet, and shortness of breath when lying down.  She experiences chronic pain issues, including a history of partial knee replacement, tibial and ankle surgery, and scoliosis with two spinal bends. She has chronic arthritis and is undergoing physical therapy. Uses meloxicam 7.5 mg every other day for pain, which she feels is not very effective, and supplements with CBD gummies for pain relief and sleep. She has reduced alcohol intake since starting Ozempic.  She has a history of elevated blood sugars and was recently started on Ozempic to manage prediabetes and aid in weight loss. Reports a  weight gain of 5-6 pounds and has been in the 210-215 pound range. She is an active person, engaging in activities like walking, swimming, and yoga, and is working on increasing her physical activity post-surgery.  She reports a non-productive cough that resolved after starting Ozempic and reducing dairy intake. No significant dietary salt intake but acknowledges using salt in cooking. Consumes two cups of caffeine daily and has significantly reduced alcohol consumption.     ROS:  As per HPI  Studies Reviewed: Marland Kitchen   EKG Interpretation Date/Time:  Monday June 16 2023 13:57:37 EST Ventricular Rate:  83 PR Interval:  184 QRS Duration:  88 QT Interval:  376 QTC Calculation: 441 R Axis:   -26  Text Interpretation: Normal sinus rhythm Cannot rule out Inferior infarct (cited on or before 26-Sep-2021) No significant change since last tracing Confirmed by Chilton Si ([id number]) on 06/16/2023 2:04:29 PM    Risk Assessment/Calculations:        Physical Exam:   VS:  BP 132/82   Pulse 84   Ht 5\' 4"  (1.626 m)   Wt 220 lb 4.8 oz (99.9 kg)   LMP 05/06/2002   SpO2 97%   BMI 37.81 kg/m  , BMI Body mass index is 37.81 kg/m. GENERAL:  Well appearing HEENT: Pupils equal round and reactive, fundi not visualized, oral mucosa unremarkable NECK:  No jugular venous distention, waveform within normal limits, carotid upstroke brisk and symmetric, no bruits, no thyromegaly LUNGS:  Clear to auscultation bilaterally HEART:  RRR.  PMI not displaced or sustained,S1 and S2 within normal limits, no S3, no S4, no clicks, no rubs, no murmurs ABD:  Flat, positive  bowel sounds normal in frequency in pitch, no bruits, no rebound, no guarding, no midline pulsatile mass, no hepatomegaly, no splenomegaly EXT:  2 plus pulses throughout, no edema, no cyanosis no clubbing SKIN:  No rashes no nodules NEURO:  Cranial nerves II through XII grossly intact, motor grossly intact throughout PSYCH:  Cognitively intact,  oriented to person place and time   ASSESSMENT AND PLAN: .    # Ascending aortic aneurysm Stable 4.3 cm aneurysm. Discussed the importance of blood pressure control to reduce stress on the aorta and prevent enlargement or rupture. -Continue Valsartan and Amlodipine. -Add low dose Metoprolol 25mg  daily to reduce sheer stress on the aorta. -Plan for echoin April 2025 to monitor aneurysm size.  # Hypertension Blood pressure readings at home in the 130s/80s-90s, slightly above the target of <130/80. Discussed the importance of lifestyle modifications including salt restriction, regular exercise, and stress management. -Continue Valsartan and Amlodipine, consider combination pill for ease of administration. -Add low dose Metoprolol 25mg  daily. -Encourage home blood pressure monitoring and report readings.  # Prediabetes Blood sugars have been elevated, currently on Ozempic and Metformin. Discussed the importance of weight loss and regular exercise in managing blood sugars. -Continue Ozempic and Metformin. -Encourage regular exercise and weight loss.  # Chronic Arthritis Pain management with Meloxicam 7.5mg  every other day and CBD gummies. Discussed the importance of physical therapy and regular exercise. -Advised to limit NSAID use -Continue physical therapy.  General Health Maintenance / Followup Plans -Referral to health coach for stress management. -Follow up in 4 months with an echocardiogram.      Signed, Chilton Si, MD

## 2023-06-17 NOTE — Telephone Encounter (Signed)
Copied from CRM [id number]. Topic: General - Other >> Jun 17, 2023 10:52 AM Clayton Bibles wrote: Reason for CRM: [name] wants to know if Dr. Ruthine Dose wants her to have blood work in February. I let her know that she has lab orders in her chart. She still wants to make sure when doctor wants her to have them.  Left detailed message for patient to return my call.

## 2023-06-28 ENCOUNTER — Encounter (HOSPITAL_BASED_OUTPATIENT_CLINIC_OR_DEPARTMENT_OTHER): Payer: Self-pay | Admitting: Cardiovascular Disease

## 2023-07-02 ENCOUNTER — Other Ambulatory Visit: Payer: Self-pay | Admitting: Family Medicine

## 2023-07-02 DIAGNOSIS — E1165 Type 2 diabetes mellitus with hyperglycemia: Secondary | ICD-10-CM

## 2023-07-02 MED ORDER — SEMAGLUTIDE(0.25 OR 0.5MG/DOS) 2 MG/3ML ~~LOC~~ SOPN
0.5000 mg | PEN_INJECTOR | SUBCUTANEOUS | 0 refills | Status: DC
Start: 2023-07-02 — End: 2023-07-23

## 2023-07-02 NOTE — Telephone Encounter (Signed)
 Copied from CRM [id number]. Topic: Clinical - Prescription Issue >> Jul 02, 2023  4:08 PM Tiffany H wrote: Reason for CRM: Patient called to ask about Ozempic titration. Patient has been on Ozempic for nearly a month and has lost 6 pounds. Patient just refilled her prescription and would like to know when to titrate up to .50mg . Please assist.   Patient advised that she has had minimal nausea and diarrhea.

## 2023-07-09 ENCOUNTER — Telehealth: Payer: Self-pay | Admitting: *Deleted

## 2023-07-09 NOTE — Telephone Encounter (Signed)
 Copied from CRM [id number]. Topic: Clinical - Medication Question >> Jul 09, 2023  9:00 AM Martinique E wrote: Reason for CRM: Patient called in asking if Dr. Ruthine Dose would be able to prescribe her a 10-day supply of Xanax as she has a trip coming up towards the end of March, and wants to calm her nerves.

## 2023-07-23 ENCOUNTER — Ambulatory Visit: Admitting: Family Medicine

## 2023-07-23 ENCOUNTER — Encounter: Payer: Self-pay | Admitting: Family Medicine

## 2023-07-23 VITALS — BP 160/80 | HR 71 | Temp 97.7°F | Resp 18 | Ht 64.0 in | Wt 214.2 lb

## 2023-07-23 DIAGNOSIS — F4000 Agoraphobia, unspecified: Secondary | ICD-10-CM

## 2023-07-23 DIAGNOSIS — E785 Hyperlipidemia, unspecified: Secondary | ICD-10-CM | POA: Diagnosis not present

## 2023-07-23 DIAGNOSIS — E1165 Type 2 diabetes mellitus with hyperglycemia: Secondary | ICD-10-CM

## 2023-07-23 DIAGNOSIS — E559 Vitamin D deficiency, unspecified: Secondary | ICD-10-CM

## 2023-07-23 DIAGNOSIS — I10 Essential (primary) hypertension: Secondary | ICD-10-CM | POA: Diagnosis not present

## 2023-07-23 DIAGNOSIS — Z7985 Long-term (current) use of injectable non-insulin antidiabetic drugs: Secondary | ICD-10-CM

## 2023-07-23 DIAGNOSIS — E1169 Type 2 diabetes mellitus with other specified complication: Secondary | ICD-10-CM

## 2023-07-23 LAB — CBC WITH DIFFERENTIAL/PLATELET
Basophils Absolute: 0.1 10*3/uL (ref 0.0–0.1)
Basophils Relative: 1.1 % (ref 0.0–3.0)
Eosinophils Absolute: 0.1 10*3/uL (ref 0.0–0.7)
Eosinophils Relative: 1.5 % (ref 0.0–5.0)
HCT: 39.9 % (ref 36.0–46.0)
Hemoglobin: 13.5 g/dL (ref 12.0–15.0)
Lymphocytes Relative: 24 % (ref 12.0–46.0)
Lymphs Abs: 1.5 10*3/uL (ref 0.7–4.0)
MCHC: 33.7 g/dL (ref 30.0–36.0)
MCV: 88.7 fl (ref 78.0–100.0)
Monocytes Absolute: 0.3 10*3/uL (ref 0.1–1.0)
Monocytes Relative: 5.5 % (ref 3.0–12.0)
Neutro Abs: 4.3 10*3/uL (ref 1.4–7.7)
Neutrophils Relative %: 67.9 % (ref 43.0–77.0)
Platelets: 294 10*3/uL (ref 150.0–400.0)
RBC: 4.5 Mil/uL (ref 3.87–5.11)
RDW: 12.4 % (ref 11.5–15.5)
WBC: 6.3 10*3/uL (ref 4.0–10.5)

## 2023-07-23 LAB — COMPREHENSIVE METABOLIC PANEL
ALT: 26 U/L (ref 0–35)
AST: 20 U/L (ref 0–37)
Albumin: 4.6 g/dL (ref 3.5–5.2)
Alkaline Phosphatase: 46 U/L (ref 39–117)
BUN: 32 mg/dL — ABNORMAL HIGH (ref 6–23)
CO2: 29 meq/L (ref 19–32)
Calcium: 10.1 mg/dL (ref 8.4–10.5)
Chloride: 100 meq/L (ref 96–112)
Creatinine, Ser: 0.68 mg/dL (ref 0.40–1.20)
GFR: 90.55 mL/min (ref >60.00)
Glucose, Bld: 133 mg/dL — ABNORMAL HIGH (ref 70–99)
Potassium: 4.5 meq/L (ref 3.5–5.1)
Sodium: 136 meq/L (ref 135–145)
Total Bilirubin: 0.3 mg/dL (ref 0.2–1.2)
Total Protein: 7.3 g/dL (ref 6.0–8.3)

## 2023-07-23 LAB — LIPID PANEL
Cholesterol: 203 mg/dL — ABNORMAL HIGH (ref 0–200)
HDL: 49.8 mg/dL (ref >39.00)
LDL Cholesterol: 113 mg/dL — ABNORMAL HIGH (ref 0–99)
NonHDL: 153.14
Total CHOL/HDL Ratio: 4
Triglycerides: 200 mg/dL — ABNORMAL HIGH (ref 0.0–149.0)
VLDL: 40 mg/dL (ref 0.0–40.0)

## 2023-07-23 LAB — TSH: TSH: 0.6 u[IU]/mL (ref 0.35–5.50)

## 2023-07-23 LAB — VITAMIN D 25 HYDROXY (VIT D DEFICIENCY, FRACTURES): VITD: 33.06 ng/mL (ref 30.00–100.00)

## 2023-07-23 LAB — HEMOGLOBIN A1C: Hgb A1c MFr Bld: 6.7 % — ABNORMAL HIGH (ref 4.6–6.5)

## 2023-07-23 MED ORDER — SEMAGLUTIDE(0.25 OR 0.5MG/DOS) 2 MG/3ML ~~LOC~~ SOPN: 0.5000 mg | PEN_INJECTOR | SUBCUTANEOUS | 0 refills | Status: DC

## 2023-07-23 MED ORDER — ALPRAZOLAM 0.5 MG PO TABS: 0.5000 mg | ORAL_TABLET | Freq: Two times a day (BID) | ORAL | 0 refills | Status: DC | PRN

## 2023-07-23 NOTE — Patient Instructions (Signed)
 It was very nice to see you today!  Have fun on vacation   PLEASE NOTE:  If you had any lab tests please let us know if you have not heard back within a few days. You may see your results on MyChart before we have a chance to review them but we will give you a call once they are reviewed by [place]. If we ordered any referrals today, please let us know if you have not heard from their office within the next week.   Please try these tips to maintain a healthy lifestyle:  Eat most of your calories during the day when you are active. Eliminate processed foods including packaged sweets (pies, cakes, cookies), reduce intake of potatoes, white bread, white pasta, and white rice. Look for whole grain options, oat flour or almond flour.  Each meal should contain half fruits/vegetables, one quarter protein, and one quarter carbs (no bigger than a computer mouse).  Cut down on sweet beverages. This includes juice, soda, and sweet tea. Also watch fruit intake, though this is a healthier sweet option, it still contains natural sugar! Limit to 3 servings daily.  Drink at least 1 glass of water with each meal and aim for at least 8 glasses per day  Exercise at least 150 minutes every week.

## 2023-07-23 NOTE — Progress Notes (Signed)
 Subjective:     Patient ID: [name], female    DOB: [birth year], 68 y.o.   MRN: [id number]  Chief Complaint  Patient presents with   New Med Request    Patient requesting medication to help with her upcoming plane ride    HPI Discussed the use of AI scribe software for clinical note transcription with the patient, who gave verbal consent to proceed.  History of Present Illness   [name] is a 68 year old female with diabetes and hypertension who presents for meds for travel   She is preparing for a vacation and requests a prescription for alprazolam to manage flight-related anxiety, as she is not comfortable with air travel. She plans to travel to [place] and has future plans for a trip to [place], emphasizing the importance of getting in shape for these travels.[name] is here to complete overdue blood work that was not done during her last visit in January. She was not informed about the need for lab work at that time and later noticed overdue notifications on her chart. She called the office but was told she was not due for any tests. She is now ready to complete the necessary blood work.  She has a history of diabetes and is currently on semaglutide (Ozempic) since the end of last year. She has experienced a weight loss of about six to seven pounds, describing it as 'slow and steady,' and notes a reduction in inches around her body. Her blood sugar readings at home are consistently around 100 to 120. She experiences mild nausea, which she attributes to common side effects of the medication, and manages it with over-the-counter nausea medication and Prilosec to prevent acid reflux. She is currently on a dose of 0.5 mg of semaglutide.  Her blood pressure has been stable at home, typically in the 120s over 70s, but she experiences occasional low readings, such as 90/66, which she monitors closely. She is on multiple antihypertensive medications, including  metoprolol, valsartan, and spironolactone and amlodipine. She has noticed improvements in her blood pressure readings since starting these medications and with her ongoing weight loss and exercise regimen.    She mentions a history of vitamin D deficiency, for which she is currently taking supplements. She also has a history of lung nodules and liver cysts, which are being monitored with periodic CT scans. She is due for a colonoscopy, which was last performed two to three years ago, and a follow-up CT scan for her lung nodules this year.       Health Maintenance Due  Topic Date Due   HEMOGLOBIN A1C  06/12/2023   Medicare Annual Wellness (AWV)  09/13/2023    Past Medical History:  Diagnosis Date   Anemia    Anxiety    Arthritis    Diabetes mellitus without complication (HCC)    Headache    Hyperlipemia    Hypertension    Lung nodule    Scoliosis    Thoracic aortic aneurysm (HCC)     Past Surgical History:  Procedure Laterality Date   ANKLE SURGERY Right    BREAST BIOPSY     CESAREAN SECTION     x3   CHOLECYSTECTOMY, LAPAROSCOPIC     ESOPHAGOGASTRODUODENOSCOPY     HEMORRHOID SURGERY     PARTIAL KNEE ARTHROPLASTY Right 10/09/2021   Procedure: UNICOMPARTMENTAL KNEE;  Surgeon: Teryl Lucy, MD;  Location: [facility];  Service: Orthopedics;  Laterality: Right;   TOTAL KNEE ARTHROPLASTY  Left 12/04/2012         Current Outpatient Medications:    ALPRAZolam (XANAX) 0.5 MG tablet, Take 1 tablet (0.5 mg total) by mouth 2 (two) times daily as needed for anxiety., Disp: 10 tablet, Rfl: 0   amLODipine-valsartan (EXFORGE) 10-320 MG tablet, Take 1 tablet by mouth daily., Disp: 90 tablet, Rfl: 3   atorvastatin (LIPITOR) 80 MG tablet, Take 1 tablet (80 mg total) by mouth daily., Disp: 90 tablet, Rfl: 1   Cholecalciferol 125 MCG (5000 UT) capsule, Take by mouth., Disp: , Rfl:    clobetasol cream (TEMOVATE) 0.05 %, Apply 1 Application topically every 3 (three) days., Disp: 60 g, Rfl: 1    DULoxetine (CYMBALTA) 60 MG capsule, Take 1 capsule (60 mg total) by mouth daily., Disp: 90 capsule, Rfl: 1   ferrous sulfate 325 (65 FE) MG tablet, Take 325 mg by mouth daily with breakfast., Disp: , Rfl:    fluticasone (FLONASE) 50 MCG/ACT nasal spray, Place 2 sprays into the nose as needed., Disp: , Rfl:    meloxicam (MOBIC) 15 MG tablet, Take 15 mg by mouth daily., Disp: , Rfl:    methocarbamol (ROBAXIN) 500 MG tablet, Take 1 tablet (500 mg total) by mouth every 6 (six) hours as needed., Disp: 90 tablet, Rfl: 1   metoprolol succinate (TOPROL XL) 25 MG 24 hr tablet, Take 1 tablet (25 mg total) by mouth daily., Disp: 90 tablet, Rfl: 1   Multiple Vitamins-Minerals (MULTIVITAMIN PO), Take 1 tablet by mouth daily., Disp: , Rfl:    Probiotic Product (PROBIOTIC DAILY PO), Take 1 capsule by mouth daily., Disp: , Rfl:    spironolactone (ALDACTONE) 25 MG tablet, Take 1 tablet (25 mg total) by mouth daily., Disp: 90 tablet, Rfl: 1   Turmeric 500 MG CAPS, Take 500 mg by mouth daily., Disp: , Rfl:    valACYclovir (VALTREX) 1000 MG tablet, Take 1,000 mg by mouth daily as needed (cold sores)., Disp: , Rfl:    Semaglutide,0.25 or 0.5MG /DOS, 2 MG/3ML SOPN, Inject 0.5 mg into the skin once a week., Disp: 3 mL, Rfl: 0  Allergies  Allergen Reactions   Ace Inhibitors Cough    Other Reaction(s): cough, Other (See Comments)  other   Latex     rash   Penicillins     Vaginal Infection    Rosuvastatin Other (See Comments)    Leg cramps  Other Reaction(s): Other (See Comments)  Other   ROS neg/noncontributory except as noted HPI/below      Objective:     BP (!) 158/77   Pulse 71   Temp 97.7 F (36.5 C) (Temporal)   Resp 18   Ht 5\' 4"  (1.626 m)   Wt 214 lb 4 oz (97.2 kg)   LMP 05/06/2002   SpO2 97%   BMI 36.78 kg/m  Wt Readings from Last 3 Encounters:  07/23/23 214 lb 4 oz (97.2 kg)  06/16/23 220 lb 4.8 oz (99.9 kg)  03/14/23 224 lb (101.6 kg)    Physical Exam   Gen: WDWN NAD HEENT:  NCAT, conjunctiva not injected, sclera nonicteric CARDIAC: RRR, S1S2+, no murmur.  MSK: no gross abnormalities.  NEURO: A&O x3.  CN II-XII intact.  PSYCH: normal mood. Good eye contact  Pdmp checked     Assessment & Plan:  Agoraphobia  Essential hypertension -     Lipid panel -     CBC with Differential/Platelet  Hyperlipidemia associated with type 2 diabetes mellitus (HCC) -  Lipid panel -     Comprehensive metabolic panel  Vitamin D deficiency -     VITAMIN D 25 Hydroxy (Vit-D Deficiency, Fractures)  Type 2 diabetes mellitus with hyperglycemia, without long-term current use of insulin (HCC) -     Comprehensive metabolic panel -     Hemoglobin A1c -     TSH -     Semaglutide(0.25 or 0.5MG /DOS); Inject 0.5 mg into the skin once a week.  Dispense: 3 mL; Refill: 0  Other orders -     ALPRAZolam; Take 1 tablet (0.5 mg total) by mouth 2 (two) times daily as needed for anxiety.  Dispense: 10 tablet; Refill: 0  Assessment and Plan    Anxiety   Experiences flight-related anxiety and uses alprazolam (Xanax) 0.5mg  for management. Prescribe alprazolam for upcoming vacation.PDMP checked   rest of dx done just to do the lab orders-not really addressed x the semaglutide   Type 2 Diabetes Mellitus   Diabetes is managed with semaglutide (Ozempic), maintaining blood glucose levels at 100-120 mg/dL and achieving a 6-7 pound weight loss. Mild nausea is controlled with over-the-counter medication and Prilosec. Continue semaglutide 0.5 mg and send prescription to pharmacy. Regularly monitor blood glucose levels and manage nausea with current medications.  Hypertension   Hypertension is well-controlled with valsartan and amlodipine (Exforge), metoprolol, and spironolactone. Blood pressure is around 120/70 mmHg, with occasional low readings normalizing upon retesting. Monitor blood pressure closely, especially with ongoing weight loss and exercise. Consider medication adjustment if blood  pressure drops too low, with options to reduce metoprolol or Exforge. Continue current medications as prescribed.  Vitamin D Deficiency   Vitamin D deficiency is managed with supplements. Order vitamin D level as part of overdue blood work.    General Health Maintenance   Due for health maintenance screenings, including a colonoscopy and a CT scan for lung nodules. Diverticulosis and liver cysts are not currently symptomatic. Order blood work today and schedule colonoscopy and CT scan as per guidelines.        Return for as sch in April.  Angelena Sole, MD

## 2023-07-24 ENCOUNTER — Encounter: Payer: Self-pay | Admitting: Family Medicine

## 2023-07-24 ENCOUNTER — Other Ambulatory Visit: Payer: Self-pay | Admitting: *Deleted

## 2023-07-24 DIAGNOSIS — E1169 Type 2 diabetes mellitus with other specified complication: Secondary | ICD-10-CM

## 2023-07-24 MED ORDER — EZETIMIBE 10 MG PO TABS: 10.0000 mg | ORAL_TABLET | Freq: Every day | ORAL | 1 refills | Status: DC

## 2023-07-24 NOTE — Progress Notes (Signed)
 1.  Cholesterol too high-LDL goal <100.  Continue atorvastatin but should restart zetia 10mg  daily #90/1 2.  Sugars at goal.  Continue meds 3.  BUN is elevated-could be some dehydration.  Make sure drinking plenty of water.  Will repeat at visit end of April so make sure drinking water that day for sure

## 2023-07-28 ENCOUNTER — Encounter: Payer: Self-pay | Admitting: Family Medicine

## 2023-08-07 ENCOUNTER — Other Ambulatory Visit (HOSPITAL_BASED_OUTPATIENT_CLINIC_OR_DEPARTMENT_OTHER): Payer: Medicare Other

## 2023-08-07 ENCOUNTER — Ambulatory Visit (HOSPITAL_BASED_OUTPATIENT_CLINIC_OR_DEPARTMENT_OTHER)

## 2023-08-07 DIAGNOSIS — I10 Essential (primary) hypertension: Secondary | ICD-10-CM | POA: Diagnosis not present

## 2023-08-07 DIAGNOSIS — I7121 Aneurysm of the ascending aorta, without rupture: Secondary | ICD-10-CM

## 2023-08-07 LAB — ECHOCARDIOGRAM COMPLETE
Area-P 1/2: 3.27 cm2
S' Lateral: 2.29 cm

## 2023-08-13 ENCOUNTER — Encounter (HOSPITAL_BASED_OUTPATIENT_CLINIC_OR_DEPARTMENT_OTHER): Payer: Self-pay | Admitting: Cardiovascular Disease

## 2023-08-18 ENCOUNTER — Telehealth: Payer: Self-pay

## 2023-08-18 NOTE — Telephone Encounter (Signed)
 Copied from CRM [id number]. Topic: General - Other >> Aug 18, 2023  9:52 AM Adonis Hoot wrote: Reason for CRM: Patient called in stating that she gets annual CT imaging done from her previous doctor due to nodules that they are keeping an eye on. She stated that she received a notice  from her previous provider that its about that time,and would like to know if the order can be placed by Dr Arva Lathe [facility]) for her to have this CT done.Patient would like to go to a location in [place].  Please Advise  Message has been sent to provider to address

## 2023-08-20 ENCOUNTER — Encounter (HOSPITAL_BASED_OUTPATIENT_CLINIC_OR_DEPARTMENT_OTHER): Payer: Self-pay

## 2023-08-20 ENCOUNTER — Other Ambulatory Visit (HOSPITAL_BASED_OUTPATIENT_CLINIC_OR_DEPARTMENT_OTHER): Payer: Self-pay | Admitting: *Deleted

## 2023-08-20 DIAGNOSIS — I7121 Aneurysm of the ascending aorta, without rupture: Secondary | ICD-10-CM

## 2023-08-20 DIAGNOSIS — I77819 Aortic ectasia, unspecified site: Secondary | ICD-10-CM

## 2023-08-21 ENCOUNTER — Encounter (HOSPITAL_BASED_OUTPATIENT_CLINIC_OR_DEPARTMENT_OTHER): Payer: Self-pay

## 2023-08-31 ENCOUNTER — Emergency Department (HOSPITAL_BASED_OUTPATIENT_CLINIC_OR_DEPARTMENT_OTHER)

## 2023-08-31 ENCOUNTER — Emergency Department (HOSPITAL_BASED_OUTPATIENT_CLINIC_OR_DEPARTMENT_OTHER)
Admission: EM | Admit: 2023-08-31 | Discharge: 2023-08-31 | Disposition: A | Discharge status: Home / Self Care | Attending: Emergency Medicine | Admitting: Emergency Medicine

## 2023-08-31 ENCOUNTER — Emergency Department (HOSPITAL_BASED_OUTPATIENT_CLINIC_OR_DEPARTMENT_OTHER): Admitting: Radiology

## 2023-08-31 ENCOUNTER — Other Ambulatory Visit: Payer: Self-pay

## 2023-08-31 DIAGNOSIS — R112 Nausea with vomiting, unspecified: Secondary | ICD-10-CM | POA: Insufficient documentation

## 2023-08-31 DIAGNOSIS — E1165 Type 2 diabetes mellitus with hyperglycemia: Secondary | ICD-10-CM | POA: Insufficient documentation

## 2023-08-31 DIAGNOSIS — R1011 Right upper quadrant pain: Secondary | ICD-10-CM | POA: Insufficient documentation

## 2023-08-31 DIAGNOSIS — I10 Essential (primary) hypertension: Secondary | ICD-10-CM | POA: Diagnosis not present

## 2023-08-31 DIAGNOSIS — R1013 Epigastric pain: Secondary | ICD-10-CM | POA: Insufficient documentation

## 2023-08-31 DIAGNOSIS — R6883 Chills (without fever): Secondary | ICD-10-CM | POA: Diagnosis not present

## 2023-08-31 DIAGNOSIS — Z91040 Latex allergy status: Secondary | ICD-10-CM | POA: Insufficient documentation

## 2023-08-31 DIAGNOSIS — Z79899 Other long term (current) drug therapy: Secondary | ICD-10-CM | POA: Insufficient documentation

## 2023-08-31 DIAGNOSIS — R109 Unspecified abdominal pain: Secondary | ICD-10-CM

## 2023-08-31 LAB — BASIC METABOLIC PANEL WITH GFR
Anion gap: 13 (ref 5–15)
BUN: 26 mg/dL — ABNORMAL HIGH (ref 8–23)
CO2: 24 mmol/L (ref 22–32)
Calcium: 10.2 mg/dL (ref 8.9–10.3)
Chloride: 100 mmol/L (ref 98–111)
Creatinine, Ser: 0.88 mg/dL (ref 0.44–1.00)
GFR, Estimated: >60 mL/min (ref >60)
Glucose, Bld: 160 mg/dL — ABNORMAL HIGH (ref 70–99)
Potassium: 4.3 mmol/L (ref 3.5–5.1)
Sodium: 137 mmol/L (ref 135–145)

## 2023-08-31 LAB — HEPATIC FUNCTION PANEL
ALT: 32 U/L (ref 0–44)
AST: 26 U/L (ref 15–41)
Albumin: 4.9 g/dL (ref 3.5–5.0)
Alkaline Phosphatase: 53 U/L (ref 38–126)
Bilirubin, Direct: 0.1 mg/dL (ref 0.0–0.2)
Indirect Bilirubin: 0.2 mg/dL — ABNORMAL LOW (ref 0.3–0.9)
Total Bilirubin: 0.4 mg/dL (ref 0.0–1.2)
Total Protein: 7.2 g/dL (ref 6.5–8.1)

## 2023-08-31 LAB — CBC
HCT: 39.4 % (ref 36.0–46.0)
Hemoglobin: 13.2 g/dL (ref 12.0–15.0)
MCH: 29.2 pg (ref 26.0–34.0)
MCHC: 33.5 g/dL (ref 30.0–36.0)
MCV: 87.2 fL (ref 80.0–100.0)
Platelets: 301 10*3/uL (ref 150–400)
RBC: 4.52 MIL/uL (ref 3.87–5.11)
RDW: 12.1 % (ref 11.5–15.5)
WBC: 8 10*3/uL (ref 4.0–10.5)
nRBC: 0 % (ref 0.0–0.2)

## 2023-08-31 LAB — TROPONIN T, HIGH SENSITIVITY: Troponin T High Sensitivity: <15 ng/L (ref <19)

## 2023-08-31 MED ORDER — ONDANSETRON 4 MG PO TBDP: 4.0000 mg | ORAL_TABLET | Freq: Three times a day (TID) | ORAL | 0 refills | Status: DC | PRN

## 2023-08-31 MED ORDER — ALUM & MAG HYDROXIDE-SIMETH 200-200-20 MG/5ML PO SUSP
30.0000 mL | Freq: Once | ORAL | Status: AC
  Administered 2023-08-31: 30 mL via ORAL
  Filled 2023-08-31: qty 30

## 2023-08-31 MED ORDER — ESOMEPRAZOLE MAGNESIUM 40 MG PO CPDR: 40.0000 mg | DELAYED_RELEASE_CAPSULE | Freq: Every day | ORAL | 0 refills | Status: DC

## 2023-08-31 MED ORDER — LIDOCAINE VISCOUS HCL 2 % MT SOLN
15.0000 mL | Freq: Once | OROMUCOSAL | Status: DC
  Filled 2023-08-31: qty 15

## 2023-08-31 MED ORDER — IOHEXOL 300 MG/ML  SOLN
100.0000 mL | Freq: Once | INTRAMUSCULAR | Status: AC | PRN
  Administered 2023-08-31: 100 mL via INTRAVENOUS

## 2023-08-31 NOTE — ED Triage Notes (Signed)
 Pt caox4, ambulatory c/o epigastric abd pain that started approx 3 days.

## 2023-08-31 NOTE — ED Provider Notes (Signed)
 [place] EMERGENCY DEPARTMENT AT [facility] Provider Note   CSN: [id number] Arrival date & time: 08/31/23  1235     History  Chief Complaint  Patient presents with   Abdominal Pain    [name] is a 68 y.o. female past medical history significant for GERD, ascending aortic aneurysm, diabetes, hypertension, anxiety, and obesity presents today for epigastric pain x 3 days.  Patient also endorses nausea, vomiting and chills.  Patient reports past cholecystectomy.  Patient states she is still passing gas and having bowel movements.  Patient does take Ozempic .  Patient denies fever, shortness of breath, chest pain, or diarrhea.   Abdominal Pain      Home Medications Prior to Admission medications   Medication Sig Start Date End Date Taking? Authorizing Provider  esomeprazole (NEXIUM) 40 MG capsule Take 1 capsule (40 mg total) by mouth daily. 08/31/23  Yes Carie Charity, PA-C  ondansetron  (ZOFRAN -ODT) 4 MG disintegrating tablet Take 1 tablet (4 mg total) by mouth every 8 (eight) hours as needed for nausea or vomiting. 08/31/23  Yes Robertt Buda N, PA-C  ALPRAZolam  (XANAX ) 0.5 MG tablet Take 1 tablet (0.5 mg total) by mouth 2 (two) times daily as needed for anxiety. 07/23/23   Christel Cousins, MD  amLODipine -valsartan  (EXFORGE ) 10-320 MG tablet Take 1 tablet by mouth daily. 06/16/23   Maudine Sos, MD  atorvastatin  (LIPITOR) 80 MG tablet Take 1 tablet (80 mg total) by mouth daily. 06/02/23   Christel Cousins, MD  Cholecalciferol 125 MCG (5000 UT) capsule Take by mouth. 06/17/22   [provider]  clobetasol  cream (TEMOVATE ) 0.05 % Apply 1 Application topically every 3 (three) days. 05/15/23   Christel Cousins, MD  DULoxetine  (CYMBALTA ) 60 MG capsule Take 1 capsule (60 mg total) by mouth daily. 06/02/23   Christel Cousins, MD  ezetimibe  (ZETIA ) 10 MG tablet Take 1 tablet (10 mg total) by mouth daily. 07/24/23   Christel Cousins, MD  ferrous sulfate  325 (65 FE) MG  tablet Take 325 mg by mouth daily with breakfast.    [provider]  fluticasone (FLONASE) 50 MCG/ACT nasal spray Place 2 sprays into the nose as needed. 01/10/22   [provider]  meloxicam  (MOBIC ) 15 MG tablet Take 15 mg by mouth daily. 06/17/23   [provider]  methocarbamol  (ROBAXIN ) 500 MG tablet Take 1 tablet (500 mg total) by mouth every 6 (six) hours as needed. 02/26/23   Christel Cousins, MD  metoprolol  succinate (TOPROL  XL) 25 MG 24 hr tablet Take 1 tablet (25 mg total) by mouth daily. 06/16/23   Maudine Sos, MD  Multiple Vitamins-Minerals (MULTIVITAMIN PO) Take 1 tablet by mouth daily.    [provider]  Probiotic Product (PROBIOTIC DAILY PO) Take 1 capsule by mouth daily.    [provider]  Semaglutide ,0.25 or 0.5MG /DOS, 2 MG/3ML SOPN Inject 0.5 mg into the skin once a week. 07/23/23   Christel Cousins, MD  spironolactone  (ALDACTONE ) 25 MG tablet Take 1 tablet (25 mg total) by mouth daily. 06/02/23   Christel Cousins, MD  Turmeric 500 MG CAPS Take 500 mg by mouth daily.    [provider]  valACYclovir (VALTREX) 1000 MG tablet Take 1,000 mg by mouth daily as needed (cold sores). 10/23/16   [provider]      Allergies    Ace inhibitors, Latex, Penicillins, and Rosuvastatin    Review of Systems   Review of Systems  Gastrointestinal:  Positive for abdominal pain.    Physical Exam Updated Vital Signs BP 126/73   Pulse 68   Temp 98.3 F (36.8 C) (Oral)   Resp 13   LMP 05/06/2002   SpO2 96%  Physical Exam Vitals and nursing note reviewed.  Constitutional:      General: She is not in acute distress.    Appearance: She is well-developed. She is not toxic-appearing or diaphoretic.  HENT:     Head: Normocephalic and atraumatic.  Eyes:     Conjunctiva/sclera: Conjunctivae normal.  Cardiovascular:     Rate and Rhythm: Normal rate and regular rhythm.     Heart sounds: Normal heart sounds. No murmur  heard. Pulmonary:     Effort: Pulmonary effort is normal. No respiratory distress.     Breath sounds: Normal breath sounds.  Abdominal:     General: There is no distension.     Palpations: Abdomen is soft.     Tenderness: There is abdominal tenderness in the right upper quadrant and epigastric area.  Musculoskeletal:        General: No swelling.     Cervical back: Neck supple.  Skin:    General: Skin is warm and dry.     Capillary Refill: Capillary refill takes less than 2 seconds.  Neurological:     General: No focal deficit present.     Mental Status: She is alert.  Psychiatric:        Mood and Affect: Mood normal.     ED Results / Procedures / Treatments   Labs (all labs ordered are listed, but only abnormal results are displayed) Labs Reviewed  BASIC METABOLIC PANEL WITH GFR - Abnormal; Notable for the following components:      Result Value   Glucose, Bld 160 (*)    BUN 26 (*)    All other components within normal limits  HEPATIC FUNCTION PANEL - Abnormal; Notable for the following components:   Indirect Bilirubin 0.2 (*)    All other components within normal limits  CBC  TROPONIN T, HIGH SENSITIVITY    EKG EKG Interpretation Date/Time:  Sunday August 31 2023 12:50:53 EDT Ventricular Rate:  72 PR Interval:  188 QRS Duration:  95 QT Interval:  394 QTC Calculation: 435 R Axis:   -29  Text Interpretation: Sinus rhythm Borderline left axis deviation Anterior infarct, old No acute changes No significant change since last tracing Confirmed by Deatra Face [phone]) on 08/31/2023 2:33:23 PM  Radiology CT ABDOMEN PELVIS W CONTRAST Result Date: 08/31/2023 CLINICAL DATA:  Bilateral upper quadrant abdominal pain for 3 days EXAM: CT ABDOMEN AND PELVIS WITH CONTRAST TECHNIQUE: Multidetector CT imaging of the abdomen and pelvis was performed using the standard protocol following bolus administration of intravenous contrast. RADIATION DOSE REDUCTION: This exam was performed  according to the departmental dose-optimization program which includes automated exposure control, adjustment of the mA and/or kV according to patient size and/or use of iterative reconstruction technique. CONTRAST:  OMNIPAQUE IOHEXOL 300 MG/ML  SOLN COMPARISON:  11/23/2021 FINDINGS: Lower chest: 7 mm right middle lobe pulmonary nodule reference image 10/2, stable and likely benign given long-term stability. No acute pleural or parenchymal lung disease. Hepatobiliary: 1.8 cm hypodensity inferior right lobe liver demonstrates centripetal enhancement compatible with hemangioma. Multiple other simple appearing hepatic cysts are noted. No biliary duct dilation. Prior cholecystectomy. Pancreas: Unremarkable. No pancreatic ductal dilatation or surrounding inflammatory changes. Spleen: Normal in size without focal abnormality. Adrenals/Urinary Tract: Stable bilateral nonobstructing renal calculi,  measuring up to 5 mm on the right and 6 mm on the left. Kidneys enhance normally. The adrenals and bladder are unremarkable. Stomach/Bowel: No bowel obstruction or ileus. Normal appendix right lower quadrant. Diffuse diverticulosis throughout the distal colon without evidence of acute diverticulitis. No bowel wall thickening or inflammatory change. Vascular/Lymphatic: Aortic atherosclerosis. No enlarged abdominal or pelvic lymph nodes. Reproductive: Stable calcified uterine fibroids. Stable 3.9 x 2.8 cm cystic area within the right adnexa. Left ovary is unremarkable. Other: No free fluid or free intraperitoneal gas. No abdominal wall hernia. Musculoskeletal: No acute or destructive bony abnormalities. Reconstructed images demonstrate no additional findings. IMPRESSION: 1. Stable bilateral nonobstructing renal calculi. No obstructive uropathy within either kidney. 2. Stable 7 mm right middle lobe pulmonary nodule, which can be considered benign given long-term stability. No specific follow-up recommended. 3. Stable 3.9 cm  cystic area within the right adnexa. Previous ultrasound evaluation failed to demonstrate a corresponding abnormality, but this area appears unchanged since the prior CT favoring benign etiology. If further imaging evaluation is desired, endovaginal pelvic ultrasound or nonemergent outpatient pelvic MRI could be considered. 4. Distal colonic diverticulosis without evidence of diverticulitis. 5.  Aortic Atherosclerosis (ICD10-I70.0). Electronically Signed   By: Bobbye Burrow M.D.   On: 08/31/2023 16:06   DG Chest 2 View Result Date: 08/31/2023 CLINICAL DATA:  Chest pain EXAM: CHEST - 2 VIEW COMPARISON:  None Available. FINDINGS: The heart size and mediastinal contours are within normal limits. Both lungs are clear. The visualized skeletal structures are unremarkable. IMPRESSION: No active cardiopulmonary disease. Electronically Signed   By: Reagan Camera M.D.   On: 08/31/2023 13:26    Procedures Procedures    Medications Ordered in ED Medications  alum & mag hydroxide-simeth (MAALOX/MYLANTA) 200-200-20 MG/5ML suspension 30 mL (has no administration in time range)    And  lidocaine (XYLOCAINE) 2 % viscous mouth solution 15 mL (has no administration in time range)  iohexol (OMNIPAQUE) 300 MG/ML solution 100 mL (100 mLs Intravenous Contrast Given 08/31/23 1423)    ED Course/ Medical Decision Making/ A&P                                 Medical Decision Making Amount and/or Complexity of Data Reviewed Labs: ordered. Radiology: ordered.  Risk Prescription drug management.   This patient presents to the ED for concern of epigastric pain, this involves an extensive number of treatment options, and is a complaint that carries with it a high risk of complications and morbidity.  The differential diagnosis includes GERD, peptic ulcer, pancreatitis, choledocholithiasis, acute cholecystitis, arrhythmia, STEMI, NSTEMI   Co morbidities that complicate the patient evaluation  Hypertension,  diabetes, GERD, hyperlipidemia   Additional history obtained: External records from outside source obtained and reviewed including cardiology office visit notes   Lab Tests:  I Ordered, and personally interpreted labs.  The pertinent results include: CBC WNL, mildly elevated glucose at 168, mildly elevated bun at 26   Imaging Studies ordered:  I ordered imaging studies including chest x-ray I independently visualized and interpreted imaging which showed no active cardiopulmonary disease I agree with the radiologist interpretation CT abdomen pelvis with contrast which showed stable bilateral nonobstructing renal calculi.  Stable 7 mm right middle lobe nodule.  Stable 3.9 cm cystic area in the right adnexa, outpatient ultrasound or pelvic MRI to be considered.  Distal colonic diverticulosis without diverticulitis.   Cardiac Monitoring: / EKG:  The patient  was maintained on a cardiac monitor.  I personally viewed and interpreted the cardiac monitored which showed an underlying rhythm of: Sinus rhythm, borderline LAD   Problem List / ED Course / Critical interventions / Medication management I ordered medication including GI cocktail for GERD Reevaluation of the patient after these medicines showed that the patient improved I have reviewed the patients home medicines and have made adjustments as needed  Test / Admission - Considered:  Consider for admission or further workup however patient's vital signs, physical exam, labs, and imaging are reassuring.  Patient notified of incidental findings on CT.  Patient given short course of Zofran  and trial of Nexium to see if this relieves her symptoms.  Patient to follow-up with her primary care for further evaluation workup and possible referral to gastroenterology.  I feel patient is safe for discharge at this time.        Final Clinical Impression(s) / ED Diagnoses Final diagnoses:  Abdominal pain, unspecified abdominal location   Nausea and vomiting, unspecified vomiting type    Rx / DC Orders ED Discharge Orders          Ordered    ondansetron  (ZOFRAN -ODT) 4 MG disintegrating tablet  Every 8 hours PRN        08/31/23 1619    esomeprazole (NEXIUM) 40 MG capsule  Daily        08/31/23 1619              Carie Charity, PA-C 08/31/23 1628    Deatra Face, MD 09/01/23 [phone]

## 2023-08-31 NOTE — ED Notes (Signed)
 Spoke with lab about hepatic function panel add-on

## 2023-08-31 NOTE — Discharge Instructions (Signed)
 Today you were seen for abdominal pain with nausea and vomiting.  Please pick up your Zofran  and Nexium and take for nausea, vomiting, and potential GERD.  Please follow-up with your primary care if her symptoms persist for further evaluation and workup thank you for letting us  treat you today. After reviewing your labs and imaging, I feel you are safe to go home. Please follow up with your PCP in the next several days and provide them with your records from this visit. Return to the Emergency Room if pain becomes severe or symptoms worsen.

## 2023-09-01 ENCOUNTER — Ambulatory Visit: Payer: Medicare Other | Admitting: Family Medicine

## 2023-09-01 ENCOUNTER — Telehealth: Payer: Self-pay | Admitting: Family Medicine

## 2023-09-01 NOTE — Telephone Encounter (Signed)
 Patient has appointment.

## 2023-09-01 NOTE — Telephone Encounter (Signed)
 Pt was seen on 08/31/23 at [facility]  Patient Name First: Sonia Last: Berry Gender: Female DOB: 05-30-1955 Age: 68 Y 10 M 8 D Return Phone Number: [phone] (Primary) Address: City/ State/ Zip: [name]  [address] Client [facility] - Human resources officer [facility] [employer] Provider Glenetta Lane Contact Type Call Who Is Calling Patient / Member / Family / Caregiver Call Type Triage / Clinical Relationship To Patient Self Return Phone Number [phone] (Primary) Chief Complaint Abdominal Pain Reason for Call Symptomatic / Request for Health Information Initial Comment Caller states she is having abdominal pain, nausea and can't eat anything solid. Translation No Nurse Assessment Nurse: Katrine Parody, RN, Maurene Sours Date/Time (Eastern Time): 08/31/2023 11:17:29 AM Confirm and document reason for call. If symptomatic, describe symptoms. ---Caller states she is having abdominal pain x3 days. Rates pain 5/10. She has been taking Ibuprofen  and eating soft foods. Reports pain to the upper abdomen. Reports having nausea, no vomiting. Having regular BMs. Denies fever. Does the patient have any new or worsening symptoms? ---Yes Will a triage be completed? ---Yes Related visit to physician within the last 2 weeks? ---No Does the PT have any chronic conditions? (i.e. diabetes, asthma, this includes High risk factors for pregnancy, etc.) ---Yes List chronic conditions. ---Diverticulosis Prediabetic HTN Is this a behavioral health or substance abuse call? ---No Guidelines Guideline Title Affirmed Question Affirmed Notes Nurse Date/Time [facility] Time) Abdominal Pain - Upper [1] Pain lasts > 10 minutes AND [2] age > 59 Katrine Parody, RN, Maurene Sours 08/31/2023 11:20:10 AM Disp. Time [facility] Time) Disposition Final User 08/31/2023 11:22:43 AM Go to ED Now Yes Katrine Parody, RN, Maurene Sours Final Disposition 08/31/2023 11:22:43 AM Go to ED Now Yes  Katrine Parody, RN, Corene Devonshire Disagree/Comply Comply Caller Understands Yes PreDisposition Call Doctor Care Advice Given Per Guideline GO TO ED NOW: * You need to be seen in the Emergency Department. NOTE TO TRIAGER - DRIVING: * Another adult should drive. BRING MEDICINES: * Bring a list of your current medicines when you go to the Emergency Department (ER). CALL EMS 911 IF: * Confusion occurs * Passes out or becomes too weak to stand * Severe difficulty breathing occurs. CARE ADVICE given per Abdominal Pain - Upper (Adult) guideline. Referrals [facility] - ED

## 2023-09-02 ENCOUNTER — Ambulatory Visit (INDEPENDENT_AMBULATORY_CARE_PROVIDER_SITE_OTHER): Payer: Medicare Other | Admitting: Family

## 2023-09-02 VITALS — BP 138/78 | HR 91 | Ht 64.0 in | Wt 215.2 lb

## 2023-09-02 DIAGNOSIS — R911 Solitary pulmonary nodule: Secondary | ICD-10-CM | POA: Diagnosis not present

## 2023-09-02 DIAGNOSIS — I700 Atherosclerosis of aorta: Secondary | ICD-10-CM

## 2023-09-02 DIAGNOSIS — E119 Type 2 diabetes mellitus without complications: Secondary | ICD-10-CM

## 2023-09-02 DIAGNOSIS — E785 Hyperlipidemia, unspecified: Secondary | ICD-10-CM

## 2023-09-02 DIAGNOSIS — I7121 Aneurysm of the ascending aorta, without rupture: Secondary | ICD-10-CM

## 2023-09-02 DIAGNOSIS — I10 Essential (primary) hypertension: Secondary | ICD-10-CM | POA: Diagnosis not present

## 2023-09-02 NOTE — Patient Instructions (Addendum)
 Medication Instructions:  Continue your current medications  *If you need a refill on your cardiac medications before your next appointment, please call your pharmacy*  Lab Work: Your physician recommends that you return for lab work 1 week prior to CT: BMET  If you have labs (blood work) drawn today and your tests are completely normal, you will receive your results only by: MyChart Message (if you have MyChart) OR A paper copy in the mail If you have any lab test that is abnormal or we need to change your treatment, we will call you to review the results.  Testing/Procedures: CT Aorta October 2025  Follow-Up: At [facility], you and your health needs are our priority.  As part of our continuing mission to provide you with exceptional heart care, our providers are all part of one team.  This team includes your primary Cardiologist (physician) and Advanced Practice Providers or APPs (Physician Assistants and Nurse Practitioners) who all work together to provide you with the care you need, when you need it.  Your next appointment:   In October with Dr. Theodis Fiscal or Clearnce Curia, NP or Slater Duncan, NP after your CT Aorta  We recommend signing up for the patient portal called "MyChart".  Sign up information is provided on this After Visit Summary.  MyChart is used to connect with patients for Virtual Visits (Telemedicine).  Patients are able to view lab/test results, encounter notes, upcoming appointments, etc.  Non-urgent messages can be sent to your provider as well.   To learn more about what you can do with MyChart, go to [email or web address].   Other Instructions   Information About Your Aneurysm  One of your tests has shown an aneurysm of your ascending aorta. The word "aneurysm" refers to a bulge in an artery (blood vessel). Most people think of them in the context of an emergency, but yours was found incidentally. At this point there is nothing you need to do  from a procedure standpoint, but there are some important things to keep in mind for day-to-day life.  Mainstays of therapy for aneurysms include very good blood pressure control, healthy lifestyle, and avoiding tobacco products and street drugs. Research has raised concern that antibiotics in the fluoroquinolone class could be associated with increased risk of having an aneurysm develop or tear. This includes medicines that end in "floxacin," like Cipro or Levaquin. Make sure to discuss this information with other healthcare providers if you require antibiotics.  Since aneurysms can run in families, you should discuss your diagnosis with first degree relatives as they may need to be screened for this. Regular mild-moderate physical exercise is important, but avoid heavy lifting/weight lifting over 30lbs, chopping wood, shoveling snow or digging heavy earth with a shovel. It is best to avoid activities that cause grunting or straining (medically referred to as a "Valsalva maneuver"). This happens when a person bears down against a closed throat to increase the strength of arm or abdominal muscles. There's often a tendency to do this when lifting heavy weights, doing sit-ups, push-ups or chin-ups, etc., but it may be harmful.  This is a finding I would expect to be monitored periodically by your cardiology team. Most unruptured thoracic aortic aneurysms cause no symptoms, so they are often found during exams for other conditions. Contact a health care provider if you develop any discomfort in your upper back, neck, abdomen, trouble swallowing, cough or hoarseness, or unexplained weight loss. Get help right away if you develop severe  pain in your upper back or abdomen that may move into your chest and arms, or any other concerning symptoms such as shortness of breath or fever.

## 2023-09-02 NOTE — Progress Notes (Signed)
 Cardiology Office Note:  .   Date:  09/04/2023  ID:  [name], DOB Mar 09, 1956, MRN [id number] PCP: Christel Cousins, MD  [facility] Providers Cardiologist:  None    History of Present Illness: .   [name] is a 68 y.o. female with history of hypertension, hyperlipidemia, ascending aortic aneurysm, diabetes, obesity.  Established with Dr. Theodis Fiscal 06/15/22 after referral from PCP.  PCP had previously transition losartan  to valsartan .  4.3 cm aneurysm discovered incidentally during imaging for diverticulitis symptoms.  Subsequent echo 08/07/2023 normal LVEF 65%, moderate LVH, grade 1 diastolic dysfunction, RV normal, ascending aorta 46 mm.  Recommended for CT aorta in 6 months for monitoring.  Discussed the use of AI scribe software for clinical note transcription with the patient, who gave verbal consent to proceed.  History of Present Illness [name] is a 68 year old female with aortic aneurysm who presents for follow-up regarding imaging results and management of her condition.  The aortic aneurysm measures 4.3 cm on CT and 4.6 cm on echo. Blood pressure is managed with metoprolol , amlodipine , valsartan , and spironolactone , with home readings in the 130s/60s and occasional spikes to 150s. No dizziness or lightheadedness is reported.  Hyperlipidemia is managed with atorvastatin  and ezetimibe , with an LDL level of 113 mg/dL. Dietary modifications are in place to improve cholesterol levels. Aortic atherosclerosis noted on prior imaging.   She is on a weight loss regimen, currently weighing 209 pounds, with daily exercise including walking and personal training. Her diet is high in protein with more plant-based foods, and she uses [employer] for meal management.   ROS: Please see the history of present illness.    All other systems reviewed and are negative.   Studies Reviewed: .        Cardiac Studies & Procedures    ______________________________________________________________________________________________     ECHOCARDIOGRAM  ECHOCARDIOGRAM COMPLETE 08/07/2023  Narrative ECHOCARDIOGRAM REPORT    Patient Name:   [name] Date of Exam: 08/07/2023 Medical Rec #:  [id number]        Height:       64.0 in Accession #:    [id number]       Weight:       214.2 lb Date of Birth:  08-15-1955        BSA:          2.014 m Patient Age:    67 years         BP:           132/82 mmHg Patient Gender: F                HR:           84 bpm. Exam Location:  Outpatient  Procedure: 2D Echo, Color Doppler, Cardiac Doppler and Strain Analysis (Both Spectral and Color Flow Doppler were utilized during procedure).  Indications:    Ascending aortic aneurysm  History:        Patient has no prior history of Echocardiogram examinations. Risk Factors:Hypertension, Dyslipidemia, Non-Smoker and Diabetes.  Sonographer:    Gelene Kelly RDCS Referring Phys: [id number] TIFFANY [place]  IMPRESSIONS   1. Left ventricular ejection fraction, by estimation, is 60 to 65%. The left ventricle has normal function. The left ventricle has no regional wall motion abnormalities. There is moderate left ventricular hypertrophy. Left ventricular diastolic parameters are consistent with Grade I diastolic dysfunction (impaired relaxation). 2. Right ventricular systolic function is normal. The right ventricular size  is normal. There is normal pulmonary artery systolic pressure. The estimated right ventricular systolic pressure is 19.6 mmHg. 3. The mitral valve is normal in structure. Trivial mitral valve regurgitation. No evidence of mitral stenosis. 4. The aortic valve was not well visualized. Aortic valve regurgitation is not visualized. No aortic stenosis is present. 5. Aortic dilatation noted. Aneurysm of the ascending aorta, measuring 46 mm. 6. The inferior vena cava is normal in size with greater than 50% respiratory  variability, suggesting right atrial pressure of 3 mmHg.  Comparison(s): 4.3 cm aortic aneurysm discovered incidentally during imaging for diverticulitis symptoms.  FINDINGS Left Ventricle: Left ventricular ejection fraction, by estimation, is 60 to 65%. The left ventricle has normal function. The left ventricle has no regional wall motion abnormalities. Global longitudinal strain performed but not reported based on interpreter judgement due to suboptimal tracking. The left ventricular internal cavity size was normal in size. There is moderate left ventricular hypertrophy. Left ventricular diastolic parameters are consistent with Grade I diastolic dysfunction (impaired relaxation).  Right Ventricle: The right ventricular size is normal. No increase in right ventricular wall thickness. Right ventricular systolic function is normal. There is normal pulmonary artery systolic pressure. The tricuspid regurgitant velocity is 2.04 m/s, and with an assumed right atrial pressure of 3 mmHg, the estimated right ventricular systolic pressure is 19.6 mmHg.  Left Atrium: Left atrial size was normal in size.  Right Atrium: Right atrial size was normal in size.  Pericardium: There is no evidence of pericardial effusion.  Mitral Valve: The mitral valve is normal in structure. Trivial mitral valve regurgitation. No evidence of mitral valve stenosis.  Tricuspid Valve: The tricuspid valve is normal in structure. Tricuspid valve regurgitation is trivial.  Aortic Valve: The aortic valve was not well visualized. Aortic valve regurgitation is not visualized. No aortic stenosis is present.  Pulmonic Valve: The pulmonic valve was not well visualized. Pulmonic valve regurgitation is trivial.  Aorta: The aortic root is normal in size and structure and aortic dilatation noted. There is an aneurysm involving the ascending aorta measuring 46 mm.  Venous: The inferior vena cava is normal in size with greater than 50%  respiratory variability, suggesting right atrial pressure of 3 mmHg.  IAS/Shunts: The interatrial septum was not well visualized.   LEFT VENTRICLE PLAX 2D LVIDd:         4.14 cm   Diastology LVIDs:         2.29 cm   LV e' medial:    5.11 cm/s LV PW:         1.66 cm   LV E/e' medial:  10.2 LV IVS:        1.32 cm   LV e' lateral:   5.11 cm/s LVOT diam:     2.10 cm   LV E/e' lateral: 10.2 LV SV:         52 LV SV Index:   26 LVOT Area:     3.46 cm   RIGHT VENTRICLE RV Basal diam:  3.60 cm RV Mid diam:    3.87 cm RV S prime:     14.50 cm/s TAPSE (M-mode): 1.9 cm  LEFT ATRIUM             Index        RIGHT ATRIUM          Index LA diam:        2.80 cm 1.39 cm/m   RA Area:     8.35 cm LA Vol (A2C):  57.0 ml 28.30 ml/m  RA Volume:   15.10 ml 7.50 ml/m LA Vol (A4C):   66.4 ml 32.96 ml/m LA Biplane Vol: 66.9 ml 33.21 ml/m AORTIC VALVE LVOT Vmax:   80.70 cm/s LVOT Vmean:  53.500 cm/s LVOT VTI:    0.150 m  AORTA Ao Root diam: 3.60 cm Ao Asc diam:  4.60 cm  MITRAL VALVE               TRICUSPID VALVE MV Area (PHT): 3.27 cm    TR Peak grad:   16.6 mmHg MV Decel Time: 232 msec    TR Vmax:        204.00 cm/s MV E velocity: 51.90 cm/s MV A velocity: 74.90 cm/s  SHUNTS MV E/A ratio:  0.69        Systemic VTI:  0.15 m Systemic Diam: 2.10 cm  Carson Clara MD Electronically signed by Carson Clara MD Signature Date/Time: 08/07/2023/2:27:53 PM    Final          ______________________________________________________________________________________________      Risk Assessment/Calculations:            Physical Exam:   VS:  BP 138/78   Pulse 91   Ht 5\' 4"  (1.626 m)   Wt 215 lb 3.2 oz (97.6 kg)   LMP 05/06/2002   SpO2 97%   BMI 36.94 kg/m    Wt Readings from Last 3 Encounters:  09/02/23 215 lb 3.2 oz (97.6 kg)  07/23/23 214 lb 4 oz (97.2 kg)  06/16/23 220 lb 4.8 oz (99.9 kg)    GEN: Well nourished, well developed in no acute distress NECK: No  JVD; No carotid bruits CARDIAC: RRR, no murmurs, rubs, gallops RESPIRATORY:  Clear to auscultation without rales, wheezing or rhonchi  ABDOMEN: Soft, non-tender, non-distended EXTREMITIES:  No edema; No deformity   ASSESSMENT AND PLAN: .    Assessment and Plan Assessment & Plan Ascending aortic aneurysm Aortic aneurysm by CT 4.3 cm and by echo 4.6 cm.  - Schedule CT Aorta in October to reassess aortic size. - Check kidney function one week prior to CT scan due to contrast use. - Continue metoprolol  succinte 25mg  daily to manage blood pressure and prevent aortic dilation. - Avoid heavy lifting and sudden pressure changes during exercise. -Avoid fluoroquinolones.  Hypertension Hypertension well-controlled with current regimen by home readings Home readings around 130/60 mmHg. Important to maintain BP below 130/80 mmHg to prevent aortic dilation. - Continue current antihypertensive regimen including amlodipine -valsartan  10-320 mg daily, metoprolol  succinate 25 mg daily, and spironolactone  25 mg daily. - Discussed to monitor BP at home at least 2 hours after medications and sitting for 5-10 minutes.   Hyperlipidemia / Aortic atherosclerosis Hyperlipidemia managed with atorvastatin  and ezetimibe . LDL goal <70 mg/dL due to aortic atherosclerosis. Compliant with medications and dietary recommendations. - Continue atorvastatin  80 mg daily and ezetimibe  10 mg daily. -07/23/2023 LDL 113.  Recommend lifestyle changes. Reassess lipid panel in 3 months. If LDL not at goal, consider [id number]  Lung nodule Monitor with periodic CT per radiology guidelines.  DM2 / Obesity  Continue to follow with PCP.  Encouraged to continue regularly exercising.  Has been modifying diet to be more plant-based.  On Ozempic  per PCP team.          Dispo: follow up after CT Aorta 02/2024  Signed, Clearnce Curia, NP

## 2023-09-05 ENCOUNTER — Encounter (HOSPITAL_BASED_OUTPATIENT_CLINIC_OR_DEPARTMENT_OTHER): Payer: Self-pay | Admitting: Family

## 2023-09-08 ENCOUNTER — Ambulatory Visit (INDEPENDENT_AMBULATORY_CARE_PROVIDER_SITE_OTHER): Admitting: Family Medicine

## 2023-09-08 ENCOUNTER — Encounter: Payer: Self-pay | Admitting: Family Medicine

## 2023-09-08 VITALS — BP 160/80 | HR 63 | Temp 97.5°F | Resp 18 | Ht 64.0 in | Wt 212.2 lb

## 2023-09-08 DIAGNOSIS — I10 Essential (primary) hypertension: Secondary | ICD-10-CM

## 2023-09-08 DIAGNOSIS — M4802 Spinal stenosis, cervical region: Secondary | ICD-10-CM

## 2023-09-08 DIAGNOSIS — E1165 Type 2 diabetes mellitus with hyperglycemia: Secondary | ICD-10-CM

## 2023-09-08 DIAGNOSIS — Z7985 Long-term (current) use of injectable non-insulin antidiabetic drugs: Secondary | ICD-10-CM

## 2023-09-08 DIAGNOSIS — E782 Mixed hyperlipidemia: Secondary | ICD-10-CM | POA: Diagnosis not present

## 2023-09-08 DIAGNOSIS — I7121 Aneurysm of the ascending aorta, without rupture: Secondary | ICD-10-CM

## 2023-09-08 MED ORDER — SEMAGLUTIDE(0.25 OR 0.5MG/DOS) 2 MG/3ML ~~LOC~~ SOPN: 0.2500 mg | PEN_INJECTOR | SUBCUTANEOUS | 1 refills | Status: DC

## 2023-09-08 MED ORDER — DULOXETINE HCL 30 MG PO CPEP: 30.0000 mg | ORAL_CAPSULE | Freq: Every day | ORAL | 1 refills | Status: DC

## 2023-09-08 MED ORDER — SPIRONOLACTONE 25 MG PO TABS: 25.0000 mg | ORAL_TABLET | Freq: Every day | ORAL | 1 refills | Status: DC

## 2023-09-08 NOTE — Progress Notes (Signed)
 Subjective:     Patient ID: [name], female    DOB: Dec 11, 1955, 68 y.o.   MRN: [id number]  Chief Complaint  Patient presents with   Medical Management of Chronic Issues    3 month follow-up Went to ER on 08/31/23 for abdominal pain, discuss scans and results done    HPI-late HTN - Pt is on  lvalsartan 320mg , amlodipine  10mg  daily, and spironolactone  25mg  .Bp's running 120's/70's-uses different cuffs. No ha/dizziness/cp/palp/edema/cough/sob.  not exercising as much.  Back issues DM Type 2 - Pt is glp 1.   Pt states that Mounjaro  2.5 mg injections weekly was too expensive, and discontinued Ozmepic due to constipation and nausea. Ozempic  0.25mg  doing well.  Too much nausea on 0.5mg .   Sugars are running 120-140. Just can't seem to lose wt.  A1C  6.7 in March.  Working out w/trainer 2x/wk HLD-mixed.  On lipitor 80.and zetia  10mg .  Intolerant of crestor Pain back, arms, hands-saw sports med-meloxicam  helps.  Saw spine doc in [place] for LUQ pain from back to abd.  Some scoliosis. Taking meloxicam  7.5mg  every other day-helps.  Depression-taking cymbalta  60mg  daily now for OA No SI. Wants to decrease Aneury asc aorta-on metoprolol  25mg  daily  Colonoscopy 5 yrs ago -always normal  Discussed the use of AI scribe software for clinical note transcription with the patient, who gave verbal consent to proceed.  History of Present Illness [name] is a 68 year old female with diabetes and hypertension who presents for follow-up on her diabetes management and recent abdominal pain.  She manages her diabetes with Ozempic  at 0.25 mg due to lethargy and nausea at higher doses. Her appetite is significantly reduced, yet her weight loss is slow, fluctuating between 212 and 209 pounds. Blood sugar levels are stable, ranging from 120 to 130 mg/dL, with a last W0J of 8.1% in March.  She was hospitalized last weekend for abdominal pain lasting three days. A CT scan was performed, revealing stable  lung nodules. The abdominal pain resolved spontaneously, and she has not experienced further issues.  Her hypertension is controlled with Exforge  (320/10 mg), spironolactone  (25 mg), and metoprolol  (25 mg), with blood pressure readings typically in the 120s/70s and a low resting heart rate. She maintains physical activity by working out with a trainer twice a week and walking 20-30 minutes daily.  She follows a high-protein diet, including protein shakes, cottage cheese, fish, and chicken, and monitors portion sizes. She uses the Fitbit app to track diet and exercise, noting slow weight loss but changes in body composition, such as reduced abdominal girth.  For hyperlipidemia, she takes Zetia  and Lipitor (80 mg) and is aware of mild arterial calcification, closely monitoring her cholesterol levels.  She uses meloxicam  for scoliosis-related pain, taking it two weeks on and one week off to minimize kidney impact. She also takes duloxetine  (60 mg) for mood and pain management, considering a dose reduction to 30 mg due to improved mood.  She experiences sleep disturbances, waking at 3 AM and struggling to return to sleep. She uses Benadryl  or melatonin to aid sleep and avoids caffeine before bed, exploring other methods to improve sleep quality.  She occasionally uses Xanax  for flying and takes Miralax  for bowel movement management. She is not currently taking metformin  but has it available if needed.  No headache, dizziness, chest pain, shortness of breath, vomiting, and only occasional nausea. No thoughts of suicide and abdominal pain has resolved.    Health Maintenance Due  Topic Date Due   Medicare Annual Wellness (AWV)  09/13/2023     Past Medical History:  Diagnosis Date   Anemia    Anxiety    Arthritis    Diabetes mellitus without complication (HCC)    Headache    Hyperlipemia    Hypertension    Lung nodule    Scoliosis    Thoracic aortic aneurysm (HCC)     Past Surgical  History:  Procedure Laterality Date   ANKLE SURGERY Right    BREAST BIOPSY     CESAREAN SECTION     x3   CHOLECYSTECTOMY, LAPAROSCOPIC     ESOPHAGOGASTRODUODENOSCOPY     HEMORRHOID SURGERY     PARTIAL KNEE ARTHROPLASTY Right 10/09/2021   Procedure: UNICOMPARTMENTAL KNEE;  Surgeon: Osa Blase, MD;  Location: [facility];  Service: Orthopedics;  Laterality: Right;   TOTAL KNEE ARTHROPLASTY Left 12/04/2012         Current Outpatient Medications:    ALPRAZolam  (XANAX ) 0.5 MG tablet, Take 1 tablet (0.5 mg total) by mouth 2 (two) times daily as needed for anxiety., Disp: 10 tablet, Rfl: 0   amLODipine -valsartan  (EXFORGE ) 10-320 MG tablet, Take 1 tablet by mouth daily., Disp: 90 tablet, Rfl: 3   atorvastatin  (LIPITOR) 80 MG tablet, Take 1 tablet (80 mg total) by mouth daily., Disp: 90 tablet, Rfl: 1   Cholecalciferol 125 MCG (5000 UT) capsule, Take by mouth., Disp: , Rfl:    clobetasol  cream (TEMOVATE ) 0.05 %, Apply 1 Application topically every 3 (three) days., Disp: 60 g, Rfl: 1   esomeprazole  (NEXIUM ) 40 MG capsule, Take 1 capsule (40 mg total) by mouth daily. (Patient taking differently: Take 40 mg by mouth daily. As needed), Disp: 30 capsule, Rfl: 0   ezetimibe  (ZETIA ) 10 MG tablet, Take 1 tablet (10 mg total) by mouth daily., Disp: 90 tablet, Rfl: 1   meloxicam  (MOBIC ) 15 MG tablet, Take 15 mg by mouth daily., Disp: , Rfl:    methocarbamol  (ROBAXIN ) 500 MG tablet, Take 1 tablet (500 mg total) by mouth every 6 (six) hours as needed., Disp: 90 tablet, Rfl: 1   metoprolol  succinate (TOPROL  XL) 25 MG 24 hr tablet, Take 1 tablet (25 mg total) by mouth daily., Disp: 90 tablet, Rfl: 1   Multiple Vitamins-Minerals (MULTIVITAMIN PO), Take 1 tablet by mouth daily., Disp: , Rfl:    Probiotic Product (PROBIOTIC DAILY PO), Take 1 capsule by mouth daily., Disp: , Rfl:    Semaglutide ,0.25 or 0.5MG /DOS, 2 MG/3ML SOPN, Inject 0.25 mg into the skin once a week., Disp: 9 mL, Rfl: 1   Turmeric 500 MG CAPS,  Take 500 mg by mouth daily., Disp: , Rfl:    valACYclovir (VALTREX) 1000 MG tablet, Take 1,000 mg by mouth daily as needed (cold sores)., Disp: , Rfl:    DULoxetine  (CYMBALTA ) 30 MG capsule, Take 1 capsule (30 mg total) by mouth daily., Disp: 90 capsule, Rfl: 1   spironolactone  (ALDACTONE ) 25 MG tablet, Take 1 tablet (25 mg total) by mouth daily., Disp: 90 tablet, Rfl: 1  Allergies  Allergen Reactions   Ace Inhibitors Cough    Other Reaction(s): cough, Other (See Comments)  other   Latex     rash   Penicillins     Vaginal Infection    Rosuvastatin Other (See Comments)    Leg cramps  Other Reaction(s): Other (See Comments)  Other   ROS neg/noncontributory except as noted HPI/below      Objective:     BP [name])  160/80 (BP Location: Left Arm, Patient Position: Sitting, Cuff Size: Large)   Pulse 63   Temp (!) 97.5 F (36.4 C) (Temporal)   Resp 18   Ht 5\' 4"  (1.626 m)   Wt 212 lb 4 oz (96.3 kg)   LMP 05/06/2002   SpO2 99%   BMI 36.43 kg/m  Wt Readings from Last 3 Encounters:  09/08/23 212 lb 4 oz (96.3 kg)  09/02/23 215 lb 3.2 oz (97.6 kg)  07/23/23 214 lb 4 oz (97.2 kg)    Physical Exam   Gen: WDWN NAD HEENT: NCAT, conjunctiva not injected, sclera nonicteric NECK:  supple, no thyromegaly, no nodes, no carotid bruits CARDIAC: RRR, S1S2+, no murmur. DP 2+B LUNGS: CTAB. No wheezes ABDOMEN:  BS+, soft, NTND, No HSM, no masses EXT:  no edema MSK: no gross abnormalities.  NEURO: A&O x3.  CN II-XII intact.  PSYCH: normal mood. Good eye contact  Reviewed card and ER    Assessment & Plan:  Type 2 diabetes mellitus with hyperglycemia, without long-term current use of insulin (HCC) -     Semaglutide (0.25 or 0.5MG /DOS); Inject 0.25 mg into the skin once a week.  Dispense: 9 mL; Refill: 1  Essential hypertension -     Spironolactone ; Take 1 tablet (25 mg total) by mouth daily.  Dispense: 90 tablet; Refill: 1  Mixed hyperlipidemia  Spinal stenosis of cervical  region -     DULoxetine  HCl; Take 1 capsule (30 mg total) by mouth daily.  Dispense: 90 capsule; Refill: 1  Aneurysm of ascending aorta without rupture (HCC)  Assessment and Plan Assessment & Plan Aortic aneurysm   The aortic aneurysm is under observation. Previous measurements showed slight variation between echocardiogram and CT, attributed to differences in technique. A CT angiogram is scheduled for October to assess the aneurysm and lung nodules. Proceed with the CT angiogram in October.  Hypertension   Hypertension is well-controlled with Exforge , spironolactone , and metoprolol . Blood pressure readings are typically in the 120s/70s range, with occasional higher readings due to stress or activity. The goal is to maintain low blood pressure to prevent stress on the aortic aneurysm. Continue Exforge  320/10 mg, spironolactone  25 mg, and metoprolol  25 mg. Renew the spironolactone  prescription and monitor blood pressure regularly.  Hyperlipidemia   Hyperlipidemia is managed with atorvastatin  and Zetia . There is mild arterial calcification. The LDL goal is less than 70 due to the presence of an aortic aneurysm. Consider a PCSK9 inhibitor if cholesterol levels do not improve. Exercise is emphasized as a key component in managing cholesterol levels. Continue atorvastatin  80 mg and Zetia . Consider a PCSK9 inhibitor if LDL goals are not met.  Type 2 diabetes mellitus   Type 2 diabetes is well-controlled with Ozempic . Hemoglobin A1c was 6.7% in March, and blood glucose levels are consistently between 120-130 mg/dL. She experiences nausea with higher doses of Ozempic , so the dose is maintained at 0.25 mg. Metformin  is available but not currently in use. Weight loss is slow but steady, and she is satisfied with the current regimen. Continue Ozempic  0.25 mg. Consider reintroducing metformin  if desired for additional weight loss. Monitor blood glucose levels regularly.  Depression   Depression is stable on  duloxetine  60 mg. She reports being in a good place mentally and desires to lower the dose. No current thoughts of suicide or self-harm. Sleep disturbances are noted, and she is exploring non-pharmacological interventions. Decrease duloxetine  to 30 mg.  Scoliosis   Scoliosis is managed with physical therapy and exercise.  She reports improvement in pain with the current regimen, including meloxicam  as needed. The exercise regimen is also aimed at strengthening muscles to support the spine. Continue physical therapy and exercise regimen. Use meloxicam  as needed, with a plan of two weeks on and one week off to manage pain.  Abdominal pain   A recent episode of abdominal pain resolved spontaneously. A CT scan showed no significant findings related to the pain. No current abdominal pain is reported.  Benign lung nodules   Lung nodules are considered benign based on recent imaging. No need for additional chest CT at this time as they will be monitored during the upcoming CT angiogram for the aortic aneurysm. Monitor lung nodules during the CT angiogram in October.    Return in about 6 months (around 03/10/2024) for chronic follow-up.  Ellsworth Haas, MD

## 2023-09-08 NOTE — Patient Instructions (Signed)

## 2023-09-17 ENCOUNTER — Other Ambulatory Visit: Payer: Self-pay | Admitting: *Deleted

## 2023-09-17 ENCOUNTER — Telehealth: Payer: Self-pay | Admitting: *Deleted

## 2023-09-17 ENCOUNTER — Encounter: Payer: Self-pay | Admitting: *Deleted

## 2023-09-17 DIAGNOSIS — E1165 Type 2 diabetes mellitus with hyperglycemia: Secondary | ICD-10-CM

## 2023-09-17 MED ORDER — TIRZEPATIDE 2.5 MG/0.5ML ~~LOC~~ SOAJ: 2.5000 mg | SUBCUTANEOUS | 0 refills | Status: DC

## 2023-09-17 NOTE — Telephone Encounter (Signed)
 Sent to the pharmacy. Patient notified of message below.

## 2023-09-17 NOTE — Telephone Encounter (Signed)
 Copied from CRM [id number]. Topic: Clinical - Prescription Issue >> Sep 17, 2023 12:17 PM Chasity T wrote: Reason for CRM: Patient is calling in to see if she could get her prescription ozipic changed to mogero. She is asking for a call back [phone]  Patient stated that it is more success with Zepbound  or Mounjaro . Patient states she called insurance and said it could possibly be covered with a PA.

## 2023-09-18 ENCOUNTER — Telehealth: Payer: Self-pay

## 2023-09-18 ENCOUNTER — Other Ambulatory Visit (HOSPITAL_COMMUNITY): Payer: Self-pay

## 2023-09-18 NOTE — Telephone Encounter (Signed)
 Pharmacy Patient Advocate Encounter   Received notification from Patient Pharmacy that prior authorization for Mounjaro  2.5 is required/requested.   Insurance verification completed.   The patient is insured through [facility] .   Per test claim: The current 28 day co-pay is, $241.33 due to patient deductible.  No PA needed at this time. This test claim was processed through [facility]- copay amounts may vary at other pharmacies due to pharmacy/plan contracts, or as the patient moves through the different stages of their insurance plan.

## 2023-09-25 ENCOUNTER — Telehealth: Payer: Self-pay | Admitting: *Deleted

## 2023-09-25 NOTE — Telephone Encounter (Signed)
 Copied from CRM [id number]. Topic: Clinical - Medication Question >> Sep 23, 2023  9:57 AM Bambi Bonine D wrote: Reason for CRM: Pt is calling to check if the request for MOUNJARO  was approved. I informed the pt that the medication has been approved and should be at the pharmacy. >> Sep 25, 2023  3:14 PM Chuck Crater wrote: Patient is calling for an update on her Mounjaro . Patient is requesting a callback.  Left message to return call.

## 2023-09-26 ENCOUNTER — Telehealth: Payer: Self-pay | Admitting: *Deleted

## 2023-09-26 NOTE — Telephone Encounter (Signed)
 Copied from CRM [id number]. Topic: Clinical - Medication Question >> Sep 26, 2023  9:59 AM Baldo Levan wrote: Reason for CRM: Patient calling asking about the Mounjaro  medication, that the pharmacy stated they did not have. Called and spoke to CAL they stated they have reached out to the pharmacy and are waiting to hear back and then will contact the patient with a response. >> Sep 26, 2023 10:49 AM Luane Rumps D wrote: Patient calling again to confirm that they are working on the prior auth, she is requesting a call if/when prior Siegfried Dress is approved.  Waiting on fax from [facility] to complete and fax back. Once completed will update patient.

## 2023-09-26 NOTE — Telephone Encounter (Signed)
 Spoke with patient and informed her that [facility] approved the Mounjaro . Patient verbalized understanding.

## 2023-10-03 ENCOUNTER — Telehealth: Payer: Self-pay | Admitting: *Deleted

## 2023-10-03 ENCOUNTER — Other Ambulatory Visit: Payer: Self-pay | Admitting: Family Medicine

## 2023-10-03 MED ORDER — ALPRAZOLAM 0.5 MG PO TABS: 0.5000 mg | ORAL_TABLET | Freq: Two times a day (BID) | ORAL | 0 refills | Status: DC | PRN

## 2023-10-03 NOTE — Telephone Encounter (Signed)
 Copied from CRM [id number]. Topic: Clinical - Medication Question >> Oct 03, 2023 11:48 AM [name] wrote: Reason for CRM: Patient called stating she will be flying in two weeks and is requesting a temporary supply of Xanax  for travel.  [facility] [id number] [place], [place] - [address] [place] [address] Phone: [phone] Fax: [phone] Hours: Not open 24 hours

## 2023-10-13 ENCOUNTER — Other Ambulatory Visit: Payer: Self-pay | Admitting: Family Medicine

## 2023-10-13 DIAGNOSIS — E1165 Type 2 diabetes mellitus with hyperglycemia: Secondary | ICD-10-CM

## 2023-10-13 MED ORDER — TIRZEPATIDE 5 MG/0.5ML ~~LOC~~ SOAJ: 5.0000 mg | SUBCUTANEOUS | 0 refills | Status: DC

## 2023-10-13 NOTE — Telephone Encounter (Unsigned)
 Copied from CRM [id number]. Topic: Clinical - Medication Refill >> Oct 13, 2023 11:59 AM Valeri Gate H wrote: Medication: tirzepatide  (MOUNJARO ) 2.5 MG/0.5ML Pen  (Patient is wanting the dose to be increased to 5MG )  Has the patient contacted their pharmacy? No (Agent: If no, request that the patient contact the pharmacy for the refill. If patient does not wish to contact the pharmacy document the reason why and proceed with request.) (Agent: If yes, when and what did the pharmacy advise?)  This is the patient's preferred pharmacy:  [facility] [id number] [place], [place] - [address] [place] [address] Phone: [phone] Fax: [phone]    Is this the correct pharmacy for this prescription? Yes If no, delete pharmacy and type the correct one.   Has the prescription been filled recently? No  Is the patient out of the medication? No  Has the patient been seen for an appointment in the last year OR does the patient have an upcoming appointment? Yes  Can we respond through MyChart? Yes  Agent: Please be advised that Rx refills may take up to 3 business days. We ask that you follow-up with your pharmacy.

## 2023-10-16 ENCOUNTER — Encounter: Payer: Self-pay | Admitting: *Deleted

## 2023-10-16 NOTE — Telephone Encounter (Signed)
 Patient notified of message below.

## 2023-11-03 ENCOUNTER — Other Ambulatory Visit: Payer: Self-pay | Admitting: Family Medicine

## 2023-11-03 MED ORDER — VALACYCLOVIR HCL 1 G PO TABS: 1000.0000 mg | ORAL_TABLET | Freq: Every day | ORAL | 1 refills | Status: AC | PRN

## 2023-11-03 NOTE — Telephone Encounter (Signed)
 Copied from CRM [id number]. Topic: Clinical - Medication Refill >> Nov 03, 2023  9:52 AM Aleatha C wrote: Medication: valACYclovir (VALTREX) 500 MG tablet  Has the patient contacted their pharmacy? No (Agent: If no, request that the patient contact the pharmacy for the refill. If patient does not wish to contact the pharmacy document the reason why and proceed with request.) (Agent: If yes, when and what did the pharmacy advise?)  This is the patient's preferred pharmacy:  [facility] [id number] [place], [place] - [address] [place] [address] Phone: [phone] Fax: [phone]   Is this the correct pharmacy for this prescription? Yes If no, delete pharmacy and type the correct one.   Has the prescription been filled recently? No  Is the patient out of the medication? Yes  Has the patient been seen for an appointment in the last year OR does the patient have an upcoming appointment? Yes  Can we respond through MyChart? No  Agent: Please be advised that Rx refills may take up to 3 business days. We ask that you follow-up with your pharmacy.

## 2023-11-05 ENCOUNTER — Encounter: Payer: Self-pay | Admitting: *Deleted

## 2023-11-05 ENCOUNTER — Telehealth: Payer: Self-pay | Admitting: Family Medicine

## 2023-11-05 NOTE — Telephone Encounter (Signed)
 Copied from CRM [id number]. Topic: Clinical - Medication Refill >> Nov 05, 2023 12:07 PM Aleatha C wrote: Medication: Gapintin   Has the patient contacted their pharmacy? No (Agent: If no, request that the patient contact the pharmacy for the refill. If patient does not wish to contact the pharmacy document the reason why and proceed with request.) (Agent: If yes, when and what did the pharmacy advise?)  This is the patient's preferred pharmacy:  [facility] [id number] [place], [place] - [address] [place] [address] Phone: [phone] Fax: [phone]   Is this the correct pharmacy for this prescription? Yes If no, delete pharmacy and type the correct one.   Has the prescription been filled recently? No  Is the patient out of the medication? Yes  Has the patient been seen for an appointment in the last year OR does the patient have an upcoming appointment? No  Can we respond through MyChart? No  Agent: Please be advised that Rx refills may take up to 3 business days. We ask that you follow-up with your pharmacy.

## 2023-11-05 NOTE — Telephone Encounter (Signed)
 Left message to return my call to get correct name of medication she is requesting.

## 2023-11-05 NOTE — Telephone Encounter (Signed)
 Tried calling patient again, left message to return my call. Sent patient a mychart message.

## 2023-11-05 NOTE — Telephone Encounter (Signed)
 Patient called and asked to clarify the name of the medication she is needing. She says gabapentin. Advised it's not on her current list or past list and asked does she currently take it. She says on occasion for her back spasms. She then says if it will be a problem, don't worry about it, I will just go to the ER, then she said I have to go, bye.

## 2023-11-06 ENCOUNTER — Other Ambulatory Visit: Payer: Self-pay | Admitting: Family Medicine

## 2023-11-06 DIAGNOSIS — E782 Mixed hyperlipidemia: Secondary | ICD-10-CM

## 2023-11-13 ENCOUNTER — Other Ambulatory Visit: Payer: Self-pay | Admitting: Family Medicine

## 2023-11-13 MED ORDER — TIRZEPATIDE 7.5 MG/0.5ML ~~LOC~~ SOAJ: 7.5000 mg | SUBCUTANEOUS | 0 refills | Status: DC

## 2023-11-13 NOTE — Telephone Encounter (Signed)
 Copied from CRM [id number]. Topic: Clinical - Medication Refill >> Nov 13, 2023 12:30 PM Melissa C wrote: Medication: tirzepatide  (MOUNJARO ) 5 MG/0.5ML Pen- patient requests this be increased to 7.5 MG dosage now   Has the patient contacted their pharmacy? No (Agent: If no, request that the patient contact the pharmacy for the refill. If patient does not wish to contact the pharmacy document the reason why and proceed with request.) (Agent: If yes, when and what did the pharmacy advise?)  This is the patient's preferred pharmacy:  [facility] [id number] [place], [place] - [address] [place] [address] Phone: [phone] Fax: [phone]  Is this the correct pharmacy for this prescription? Yes If no, delete pharmacy and type the correct one.   Has the prescription been filled recently? No  Is the patient out of the medication? No- 1 dose left  Has the patient been seen for an appointment in the last year OR does the patient have an upcoming appointment? Yes  Can we respond through MyChart? Yes  Agent: Please be advised that Rx refills may take up to 3 business days. We ask that you follow-up with your pharmacy.

## 2023-11-14 ENCOUNTER — Other Ambulatory Visit: Payer: Self-pay | Admitting: Family Medicine

## 2023-11-17 ENCOUNTER — Ambulatory Visit (INDEPENDENT_AMBULATORY_CARE_PROVIDER_SITE_OTHER)

## 2023-11-17 VITALS — Ht 64.0 in | Wt 205.0 lb

## 2023-11-17 DIAGNOSIS — Z0000 Encounter for general adult medical examination without abnormal findings: Secondary | ICD-10-CM | POA: Diagnosis not present

## 2023-11-17 DIAGNOSIS — E1165 Type 2 diabetes mellitus with hyperglycemia: Secondary | ICD-10-CM | POA: Diagnosis not present

## 2023-11-17 NOTE — Addendum Note (Signed)
 Addended by: JAYLENE ELLOUISE DEL on: 11/17/2023 03:55 PM   Modules accepted: Level of Service

## 2023-11-17 NOTE — Patient Instructions (Signed)
 [name] , Thank you for taking time out of your busy schedule to complete your Annual Wellness Visit with me. I enjoyed our conversation and look forward to speaking with you again next year. I, as well as your care team,  appreciate your ongoing commitment to your health goals. Please review the following plan we discussed and let me know if I can assist you in the future. Your Game plan/ To Do List    Referrals: If you haven't heard from the office you've been referred to, please reach out to them at the phone provided.   Follow up Visits: Next Medicare AWV with our clinical staff: 11/22/24   Have you seen your provider in the last 6 months (3 months if uncontrolled diabetes)? Yes Next Office Visit with your provider: 03/15/24  Clinician Recommendations:  Aim for 30 minutes of exercise or brisk walking, 6-8 glasses of water, and 5 servings of fruits and vegetables each day.       This is a list of the screening recommended for you and due dates:  Health Maintenance  Topic Date Due   Yearly kidney health urinalysis for diabetes  Never done   COVID-19 Vaccine (10 - 2024-25 season) 01/05/2023   Complete foot exam   09/13/2023   Medicare Annual Wellness Visit  09/13/2023   DEXA scan (bone density measurement)  02/25/2024*   Colon Cancer Screening  07/22/2024*   Flu Shot  12/05/2023   Hemoglobin A1C  01/23/2024   Eye exam for diabetics  02/10/2024   Mammogram  06/20/2024   Yearly kidney function blood test for diabetes  08/30/2024   DTaP/Tdap/Td vaccine (4 - Td or Tdap) 02/09/2031   Pneumococcal Vaccine for age over 41  Completed   Zoster (Shingles) Vaccine  Completed   Hepatitis B Vaccine  Aged Out   HPV Vaccine  Aged Out   Meningitis B Vaccine  Aged Out   Hepatitis C Screening  Discontinued  *Topic was postponed. The date shown is not the original due date.    Advanced directives: (Declined) Advance directive discussed with you today. Even though you declined this today, please  call our office should you change your mind, and we can give you the proper paperwork for you to fill out. Advance Care Planning is important because it:  [x]  Makes sure you receive the medical care that is consistent with your values, goals, and preferences  [x]  It provides guidance to your family and loved ones and reduces their decisional burden about whether or not they are making the right decisions based on your wishes.  Follow the link provided in your after visit summary or read over the paperwork we have mailed to you to help you started getting your Advance Directives in place. If you need assistance in completing these, please reach out to us  so that we can help you!  See attachments for Preventive Care and Fall Prevention Tips.

## 2023-11-17 NOTE — Progress Notes (Signed)
 Subjective:   [name] is a 68 y.o. who presents for a Medicare Wellness preventive visit.  As a reminder, Annual Wellness Visits don't include a physical exam, and some assessments may be limited, especially if this visit is performed virtually. We may recommend an in-person follow-up visit with your provider if needed.  Visit Complete: Virtual I connected with  [name] on 11/17/23 by a audio enabled telemedicine application and verified that I am speaking with the correct person using two identifiers.  Patient Location: Home  Provider Location: Office/Clinic  I discussed the limitations of evaluation and management by telemedicine. The patient expressed understanding and agreed to proceed.  Vital Signs: Because this visit was a virtual/telehealth visit, some criteria may be missing or patient reported. Any vitals not documented were not able to be obtained and vitals that have been documented are patient reported.  VideoDeclined- This patient declined Librarian, academic. Therefore the visit was completed with audio only.  Persons Participating in Visit: Patient.  AWV Questionnaire: No: Patient Medicare AWV questionnaire was not completed prior to this visit.  Cardiac Risk Factors include: advanced age (>44men, >22 women);dyslipidemia;diabetes mellitus;hypertension;obesity (BMI >30kg/m2)     Objective:    Today's Vitals   11/17/23 1008  Weight: 205 lb (93 kg)  Height: 5' 4 (1.626 m)   Body mass index is 35.19 kg/m.     11/17/2023   10:12 AM 10/12/2021   12:01 PM 09/26/2021    9:55 AM  Advanced Directives  Does Patient Have a Medical Advance Directive? No No No  Would patient like information on creating a medical advance directive? No - Patient declined No - Patient declined     Current Medications (verified) Outpatient Encounter Medications as of 11/17/2023  Medication Sig   ALPRAZolam  (XANAX ) 0.5 MG tablet Take 1 tablet  (0.5 mg total) by mouth 2 (two) times daily as needed for anxiety.   amLODipine -valsartan  (EXFORGE ) 10-320 MG tablet Take 1 tablet by mouth daily.   atorvastatin  (LIPITOR) 80 MG tablet TAKE 1 TABLET BY MOUTH DAILY   baclofen  (LIORESAL ) 10 MG tablet Take 10 mg by mouth 3 (three) times daily.   Cholecalciferol 125 MCG (5000 UT) capsule Take by mouth.   clobetasol  cream (TEMOVATE ) 0.05 % Apply 1 Application topically every 3 (three) days.   DULoxetine  (CYMBALTA ) 30 MG capsule Take 1 capsule (30 mg total) by mouth daily.   ezetimibe  (ZETIA ) 10 MG tablet Take 1 tablet (10 mg total) by mouth daily.   MEDROL 4 MG TBPK tablet Take by mouth.   methocarbamol  (ROBAXIN ) 500 MG tablet Take 1 tablet (500 mg total) by mouth every 6 (six) hours as needed.   metoprolol  succinate (TOPROL  XL) 25 MG 24 hr tablet Take 1 tablet (25 mg total) by mouth daily.   Multiple Vitamins-Minerals (MULTIVITAMIN PO) Take 1 tablet by mouth daily.   Probiotic Product (PROBIOTIC DAILY PO) Take 1 capsule by mouth daily.   spironolactone  (ALDACTONE ) 25 MG tablet Take 1 tablet (25 mg total) by mouth daily.   tirzepatide  (MOUNJARO ) 7.5 MG/0.5ML Pen Inject 7.5 mg into the skin once a week.   Turmeric 500 MG CAPS Take 500 mg by mouth daily.   valACYclovir  (VALTREX ) 1000 MG tablet Take 1 tablet (1,000 mg total) by mouth daily as needed (cold sores).   [DISCONTINUED] esomeprazole  (NEXIUM ) 40 MG capsule Take 1 capsule (40 mg total) by mouth daily. (Patient taking differently: Take 40 mg by mouth daily. As needed)   [  DISCONTINUED] meloxicam  (MOBIC ) 15 MG tablet Take 15 mg by mouth daily.   No facility-administered encounter medications on file as of 11/17/2023.    Allergies (verified) Ace inhibitors, Latex, Penicillins, and Rosuvastatin   History: Past Medical History:  Diagnosis Date   Anemia    Anxiety    Arthritis    Diabetes mellitus without complication (HCC)    Headache    Hyperlipemia    Hypertension    Lung nodule     Scoliosis    Thoracic aortic aneurysm (HCC)    Past Surgical History:  Procedure Laterality Date   ANKLE SURGERY Right    BREAST BIOPSY     CESAREAN SECTION     x3   CHOLECYSTECTOMY, LAPAROSCOPIC     ESOPHAGOGASTRODUODENOSCOPY     HEMORRHOID SURGERY     PARTIAL KNEE ARTHROPLASTY Right 10/09/2021   Procedure: UNICOMPARTMENTAL KNEE;  Surgeon: Josefina Chew, MD;  Location: [facility];  Service: Orthopedics;  Laterality: Right;   TOTAL KNEE ARTHROPLASTY Left 12/04/2012       Family History  Problem Relation Age of Onset   Diabetes Mother    COPD Mother    Hypertension Mother    Heart attack Father 57   Early death Father    Diabetes Father    Hypertension Father    Heart disease Father    Hyperlipidemia Sister    Hypertension Sister    Heart disease Brother    Hyperlipidemia Brother    Hypertension Brother    Coronary artery disease Brother    Social History   Socioeconomic History   Marital status: Married    Spouse name: Not on file   Number of children: 3   Years of education: Not on file   Highest education level: Master's degree (e.g., MA, MS, MEng, MEd, MSW, MBA)  Occupational History   Not on file  Tobacco Use   Smoking status: Never   Smokeless tobacco: Never  Vaping Use   Vaping status: Never Used  Substance and Sexual Activity   Alcohol use: Yes    Alcohol/week: 5.0 - 6.0 standard drinks of alcohol    Types: 5 - 6 Standard drinks or equivalent per week    Comment: 2 glass 2 times per  week   Drug use: Never   Sexual activity: Yes    Partners: Male    Birth control/protection: Post-menopausal  Other Topics Concern   Not on file  Social History Narrative   1 grand   Social Drivers of Health   Financial Resource Strain: Low Risk  (11/17/2023)   Overall Financial Resource Strain (CARDIA)    Difficulty of Paying Living Expenses: Not hard at all  Food Insecurity: No Food Insecurity (11/17/2023)   Hunger Vital Sign    Worried About Running Out of Food  in the Last Year: Never true    Ran Out of Food in the Last Year: Never true  Transportation Needs: No Transportation Needs (11/17/2023)   PRAPARE - Administrator, Civil Service (Medical): No    Lack of Transportation (Non-Medical): No  Physical Activity: Sufficiently Active (11/17/2023)   Exercise Vital Sign    Days of Exercise per Week: 6 days    Minutes of Exercise per Session: 50 min  Stress: No Stress Concern Present (11/17/2023)   [employer] of Occupational Health - Occupational Stress Questionnaire    Feeling of Stress: Not at all  Social Connections: Moderately Integrated (11/17/2023)   Social Connection and Isolation Panel  Frequency of Communication with Friends and Family: More than three times a week    Frequency of Social Gatherings with Friends and Family: More than three times a week    Attends Religious Services: Never    Database administrator or Organizations: Yes    Attends Engineer, structural: 1 to 4 times per year    Marital Status: Married    Tobacco Counseling Counseling given: Not Answered    Clinical Intake:  Pre-visit preparation completed: Yes  Pain : No/denies pain     BMI - recorded: 35.19 Nutritional Status: BMI > 30  Obese Nutritional Risks: None Diabetes: Yes CBG done?: No Did pt. bring in CBG monitor from home?: No  Lab Results  Component Value Date   HGBA1C 6.7 (H) 07/23/2023   HGBA1C 6.8 (H) 12/10/2022     How often do you need to have someone help you when you read instructions, pamphlets, or other written materials from your doctor or pharmacy?: 1 - Never  Interpreter Needed?: No  Information entered by :: Ellouise Haws, LPN   Activities of Daily Living     11/17/2023   10:19 AM  In your present state of health, do you have any difficulty performing the following activities:  Hearing? 0  Vision? 0  Difficulty concentrating or making decisions? 0  Walking or climbing stairs? 0  Dressing or  bathing? 0  Doing errands, shopping? 0  Preparing Food and eating ? N  Using the Toilet? N  In the past six months, have you accidently leaked urine? N  Do you have problems with loss of bowel control? N  Managing your Medications? N  Managing your Finances? N  Housekeeping or managing your Housekeeping? N    Patient Care Team: Wendolyn Jenkins Jansky, MD as PCP - General (Family Medicine) Raford Riggs, MD as PCP - Cardiology (Cardiology)  I have updated your Care Teams any recent Medical Services you may have received from other providers in the past year.     Assessment:   This is a routine wellness examination for [name].  Hearing/Vision screen Hearing Screening - Comments:: Pt denies any hearing issues  Vision Screening - Comments:: Wears rx glasses - up to date with routine eye exams with [facility]    Goals Addressed             This Visit's Progress    Patient Stated       Continue to lose weight        Depression Screen     11/17/2023   10:14 AM 06/02/2023    1:48 PM 02/25/2023    3:08 PM 11/11/2022   10:35 AM  PHQ 2/9 Scores  PHQ - 2 Score 0 2 0 0  PHQ- 9 Score 0 6 3 2     Fall Risk     11/17/2023   10:13 AM 06/02/2023    1:49 PM 02/25/2023    3:04 PM 11/11/2022   10:35 AM  Fall Risk   Falls in the past year? 0 0 0 0  Number falls in past yr: 0 0 0 0  Injury with Fall? 0 0 0 0  Risk for fall due to : No Fall Risks No Fall Risks No Fall Risks No Fall Risks  Follow up Falls prevention discussed Falls evaluation completed Falls evaluation completed Falls evaluation completed    MEDICARE RISK AT HOME:  Medicare Risk at Home Any stairs in or around the home?: Yes If so,  are there any without handrails?: No Home free of loose throw rugs in walkways, pet beds, electrical cords, etc?: Yes Adequate lighting in your home to reduce risk of falls?: Yes Life alert?: No Use of a cane, walker or w/c?: No Grab bars in the bathroom?: Yes Shower chair or  bench in shower?: No Elevated toilet seat or a handicapped toilet?: No  TIMED UP AND GO:  Was the test performed?  No  Cognitive Function: 6CIT completed        11/17/2023   10:16 AM  6CIT Screen  What Year? 0 points  What month? 0 points  What time? 0 points  Count back from 20 0 points  Months in reverse 0 points  Repeat phrase 0 points  Total Score 0 points    Immunizations Immunization History  Administered Date(s) Administered    Astrazeneca Covid-19 Vaccine, Pf, 0.5 Ml Non Us   07/25/2019, 08/15/2019   Influenza Inj Mdck Quad Pf 02/08/2021   Influenza, High Dose Seasonal PF 03/13/2022   Influenza, Quadrivalent, Recombinant, Inj, Pf 02/05/2018   Influenza,inj,quad, With Preservative 02/03/2018   Influenza,trivalent, recombinat, inj, PF 02/12/2023   Influenza-Unspecified 02/03/2018, 02/08/2021, 02/12/2023   PFIZER Comirnaty(Gray Top)Covid-19 Tri-Sucrose Vaccine 07/25/2019, 08/15/2019, 02/18/2020   PFIZER(Purple Top)SARS-COV-2 Vaccination 07/25/2019, 08/15/2019, 02/18/2020, 02/19/2021   PNEUMOCOCCAL CONJUGATE-20 10/11/2022   Rsv, Bivalent, Protein Subunit Rsvpref,pf [place]low) 10/04/2022   Tdap 09/18/2005, 01/13/2020, 02/08/2021   Unspecified SARS-COV-2 Vaccination 07/25/2019, 08/15/2019   Zoster Recombinant(Shingrix) 10/04/2022, 01/24/2023    Screening Tests Health Maintenance  Topic Date Due   Diabetic kidney evaluation - Urine ACR  Never done   COVID-19 Vaccine (10 - 2024-25 season) 01/05/2023   FOOT EXAM  09/13/2023   DEXA SCAN  02/25/2024 (Originally 10/22/2021)   Colonoscopy  07/22/2024 (Originally 01/01/2023)   INFLUENZA VACCINE  12/05/2023   HEMOGLOBIN A1C  01/23/2024   OPHTHALMOLOGY EXAM  02/10/2024   MAMMOGRAM  06/20/2024   Diabetic kidney evaluation - eGFR measurement  08/30/2024   Medicare Annual Wellness (AWV)  11/16/2024   DTaP/Tdap/Td (4 - Td or Tdap) 02/09/2031   Pneumococcal Vaccine: 50+ Years  Completed   Zoster Vaccines- Shingrix  Completed    Hepatitis B Vaccines  Aged Out   HPV VACCINES  Aged Out   Meningococcal B Vaccine  Aged Out   Hepatitis C Screening  Discontinued    Health Maintenance  Health Maintenance Due  Topic Date Due   Diabetic kidney evaluation - Urine ACR  Never done   COVID-19 Vaccine (10 - 2024-25 season) 01/05/2023   FOOT EXAM  09/13/2023   Health Maintenance Items Addressed: See Nurse Notes at the end of this note  Additional Screening:  Vision Screening: Recommended annual ophthalmology exams for early detection of glaucoma and other disorders of the eye. Would you like a referral to an eye doctor? No    Dental Screening: Recommended annual dental exams for proper oral hygiene  Community Resource Referral / Chronic Care Management: CRR required this visit?  No   CCM required this visit?  No   Plan:    I have personally reviewed and noted the following in the patient's chart:   Medical and social history Use of alcohol, tobacco or illicit drugs  Current medications and supplements including opioid prescriptions. Patient is not currently taking opioid prescriptions. Functional ability and status Nutritional status Physical activity Advanced directives List of other physicians Hospitalizations, surgeries, and ER visits in previous 12 months Vitals Screenings to include cognitive, depression, and falls Referrals and  appointments  In addition, I have reviewed and discussed with patient certain preventive protocols, quality metrics, and best practice recommendations. A written personalized care plan for preventive services as well as general preventive health recommendations were provided to patient.   Ellouise VEAR Haws, LPN   2/85/7974   After Visit Summary: (MyChart) Due to this being a telephonic visit, the after visit summary with patients personalized plan was offered to patient via MyChart   Notes: Nothing significant to report at this time. Foot exam due at next appt  03/15/24

## 2023-12-10 ENCOUNTER — Telehealth: Payer: Self-pay | Admitting: Family Medicine

## 2023-12-10 ENCOUNTER — Other Ambulatory Visit: Payer: Self-pay | Admitting: *Deleted

## 2023-12-10 MED ORDER — TIRZEPATIDE 7.5 MG/0.5ML ~~LOC~~ SOAJ: 7.5000 mg | SUBCUTANEOUS | 0 refills | Status: DC

## 2023-12-10 NOTE — Telephone Encounter (Unsigned)
 Copied from CRM [id number]. Topic: Clinical - Medication Refill >> Dec 10, 2023  3:07 PM Gennette ORN wrote: Medication: tirzepatide  (MOUNJARO ) 7.5 MG/0.5ML Pen  Has the patient contacted their pharmacy? No (Agent: If no, request that the patient contact the pharmacy for the refill. If patient does not wish to contact the pharmacy document the reason why and proceed with request.) (Agent: If yes, when and what did the pharmacy advise?)  This is the patient's preferred pharmacy:  [facility] [id number] [place], [place] - [address] [place] [address] Phone: [phone] Fax: [phone]   Is this the correct pharmacy for this prescription? Yes If no, delete pharmacy and type the correct one.   Has the prescription been filled recently? Yes  Is the patient out of the medication? Yes  Has the patient been seen for an appointment in the last year OR does the patient have an upcoming appointment? Yes  Can we respond through MyChart? Yes  Agent: Please be advised that Rx refills may take up to 3 business days. We ask that you follow-up with your pharmacy.

## 2023-12-10 NOTE — Telephone Encounter (Signed)
 Rx sent to the pharmacy.

## 2024-01-08 ENCOUNTER — Other Ambulatory Visit: Payer: Self-pay | Admitting: Family Medicine

## 2024-01-08 MED ORDER — TIRZEPATIDE 7.5 MG/0.5ML ~~LOC~~ SOAJ: 7.5000 mg | SUBCUTANEOUS | 0 refills | Status: DC

## 2024-01-08 NOTE — Telephone Encounter (Signed)
 Copied from CRM [id number]. Topic: Clinical - Medication Refill >> Jan 08, 2024  9:28 AM Rosina BIRCH wrote: Medication: tirzepatide  (MOUNJARO ) 7.5 MG/0.5ML Pen  Has the patient contacted their pharmacy? No (Agent: If no, request that the patient contact the pharmacy for the refill. If patient does not wish to contact the pharmacy document the reason why and proceed with request.) (Agent: If yes, when and what did the pharmacy advise?)  This is the patient's preferred pharmacy:  [facility] [id number], [place] - [address] [place] [address] Phone: [phone] Fax: [phone]   Is this the correct pharmacy for this prescription? Yes If no, delete pharmacy and type the correct one.   Has the prescription been filled recently? Yes  Is the patient out of the medication? Yes  Has the patient been seen for an appointment in the last year OR does the patient have an upcoming appointment? Yes  Can we respond through MyChart? Yes  Agent: Please be advised that Rx refills may take up to 3 business days. We ask that you follow-up with your pharmacy.

## 2024-02-03 ENCOUNTER — Other Ambulatory Visit: Payer: Self-pay | Admitting: Family Medicine

## 2024-02-03 ENCOUNTER — Other Ambulatory Visit (HOSPITAL_BASED_OUTPATIENT_CLINIC_OR_DEPARTMENT_OTHER): Payer: Self-pay | Admitting: Cardiovascular Disease

## 2024-02-03 ENCOUNTER — Ambulatory Visit: Payer: Self-pay

## 2024-02-03 NOTE — Telephone Encounter (Unsigned)
 Copied from CRM #[id number]. Topic: Clinical - Medication Refill >> Feb 03, 2024 12:20 PM Drema MATSU wrote: Medication: tirzepatide  (MOUNJARO ) 7.5 MG/0.5ML Pen *Patient wants to decrease dosage back to 5  Has the patient contacted their pharmacy? no (Agent: If no, request that the patient contact the pharmacy for the refill. If patient does not wish to contact the pharmacy document the reason why and proceed with request.) (Agent: If yes, when and what did the pharmacy advise?)  This is the patient's preferred pharmacy:  [facility] [id number] [place], [place] - [address] [place] [address] Phone: [phone] Fax: [phone]  Is this the correct pharmacy for this prescription? Yes If no, delete pharmacy and type the correct one.   Has the prescription been filled recently? No  Is the patient out of the medication? No 1 more   Has the patient been seen for an appointment in the last year OR does the patient have an upcoming appointment? Yes  Can we respond through MyChart? Yes  Agent: Please be advised that Rx refills may take up to 3 business days. We ask that you follow-up with your pharmacy.

## 2024-02-03 NOTE — Telephone Encounter (Signed)
 FYI Only or Action Required?: Action required by provider: clinical question for provider and update on patient condition.  Patient was last seen in primary care on 09/08/2023 by Wendolyn Jenkins Jansky, MD.  Called Nurse Triage reporting Medication Problem.  Symptoms began about a month ago.  Symptoms are: unchanged.  Triage Disposition: Call PCP When Office is Open  Patient/caregiver understands and will follow disposition?: Yes        Copied from CRM #[id number]. Topic: Clinical - Red Word Triage >> Feb 03, 2024 12:23 PM Drema MATSU wrote: Red Word that prompted transfer to Nurse Triage: Patient is experiencing symptoms from Mounjaro  7.5. She is wanting to lower dose. Patient is experiencing a lot of nausea.        Reason for Disposition  [1] Caller has NON-URGENT medicine question about med that PCP prescribed AND [2] triager unable to answer question  Answer Assessment - Initial Assessment Questions Patient reports intermittent nausea for the last month with her Mounjaro  injection and would like to know if the dose could be lowered. Please advise.      1. NAME of MEDICINE: What medicine(s) are you calling about?     Mounjaro   2. QUESTION: What is your question? (e.g., double dose of medicine, side effect)     Side effect  3. PRESCRIBER: Who prescribed the medicine? Reason: if prescribed by specialist, call should be referred to that group.     Dr. Wendolyn 4. SYMPTOMS: Do you have any symptoms? If Yes, ask: What symptoms are you having?  How bad are the symptoms (e.g., mild, moderate, severe)     Intermittent nausea for the last month  Protocols used: Medication Question Call-A-AH

## 2024-02-03 NOTE — Telephone Encounter (Signed)
 Patient asking to decrease dose back down to 5mg .

## 2024-02-03 NOTE — Telephone Encounter (Signed)
 Noted. appt scheduled

## 2024-02-04 ENCOUNTER — Ambulatory Visit: Admitting: Family Medicine

## 2024-02-05 ENCOUNTER — Other Ambulatory Visit: Payer: Self-pay | Admitting: Family Medicine

## 2024-02-05 ENCOUNTER — Telehealth: Payer: Self-pay

## 2024-02-05 MED ORDER — TIRZEPATIDE 5 MG/0.5ML ~~LOC~~ SOAJ: 5.0000 mg | SUBCUTANEOUS | 1 refills | Status: DC

## 2024-02-05 NOTE — Telephone Encounter (Signed)
 Spoke with patient, says she still wants to take the mounjaro , but at a lower dose. She cancelled her appt due to her being sick.

## 2024-02-05 NOTE — Telephone Encounter (Signed)
 Left VM for patient letting her know 5mg  Mounjaro  has been sent to [facility].

## 2024-02-05 NOTE — Telephone Encounter (Signed)
 It only shows that the request to lower to 5mg  was refused. Appt was set yesterday to discuss further but pt no showed. Please advise.  Copied from CRM [id number]. Topic: Clinical - Medication Question >> Feb 05, 2024  9:56 AM Dedra B wrote: Reason for CRM: Pt wants to know why her mounjaro  refill request was refused. She said she will be out after this week. Pls call pt.

## 2024-02-18 ENCOUNTER — Encounter (HOSPITAL_BASED_OUTPATIENT_CLINIC_OR_DEPARTMENT_OTHER): Payer: Self-pay

## 2024-02-19 ENCOUNTER — Ambulatory Visit (HOSPITAL_BASED_OUTPATIENT_CLINIC_OR_DEPARTMENT_OTHER): Payer: Self-pay | Admitting: Family

## 2024-02-19 ENCOUNTER — Ambulatory Visit (HOSPITAL_BASED_OUTPATIENT_CLINIC_OR_DEPARTMENT_OTHER): Admission: RE | Admit: 2024-02-19 | Source: Ambulatory Visit

## 2024-02-19 DIAGNOSIS — I7121 Aneurysm of the ascending aorta, without rupture: Secondary | ICD-10-CM

## 2024-02-19 DIAGNOSIS — I77819 Aortic ectasia, unspecified site: Secondary | ICD-10-CM

## 2024-02-19 LAB — BASIC METABOLIC PANEL WITH GFR
BUN/Creatinine Ratio: 29 — ABNORMAL HIGH (ref 12–28)
BUN: 25 mg/dL (ref 8–27)
CO2: 23 mmol/L (ref 20–29)
Calcium: 10 mg/dL (ref 8.7–10.3)
Chloride: 99 mmol/L (ref 96–106)
Creatinine, Ser: 0.87 mg/dL (ref 0.57–1.00)
Glucose: 101 mg/dL — ABNORMAL HIGH (ref 70–99)
Potassium: 4.8 mmol/L (ref 3.5–5.2)
Sodium: 137 mmol/L (ref 134–144)
eGFR: 73 mL/min/1.73 (ref >59)

## 2024-02-26 ENCOUNTER — Encounter (HOSPITAL_BASED_OUTPATIENT_CLINIC_OR_DEPARTMENT_OTHER): Payer: Self-pay

## 2024-02-26 ENCOUNTER — Ambulatory Visit (HOSPITAL_BASED_OUTPATIENT_CLINIC_OR_DEPARTMENT_OTHER)
Admission: RE | Admit: 2024-02-26 | Discharge: 2024-02-26 | Disposition: A | Discharge status: Home / Self Care | Source: Ambulatory Visit | Attending: Family | Admitting: Family

## 2024-02-26 DIAGNOSIS — I7121 Aneurysm of the ascending aorta, without rupture: Secondary | ICD-10-CM | POA: Diagnosis present

## 2024-02-26 MED ORDER — IOHEXOL 350 MG/ML SOLN
100.0000 mL | Freq: Once | INTRAVENOUS | Status: AC | PRN
  Administered 2024-02-26: 100 mL via INTRAVENOUS

## 2024-02-27 ENCOUNTER — Encounter (HOSPITAL_BASED_OUTPATIENT_CLINIC_OR_DEPARTMENT_OTHER): Payer: Self-pay

## 2024-03-01 ENCOUNTER — Encounter (HOSPITAL_BASED_OUTPATIENT_CLINIC_OR_DEPARTMENT_OTHER): Payer: Self-pay

## 2024-03-01 NOTE — Telephone Encounter (Signed)
-----   Message from Reche GORMAN Finder sent at 03/01/2024  8:29 AM EDT ----- CT with ascending aorta 4.8 cm which is slightly increased from previous. Recommend repeat CT Aorta in 6 months for monitoring.  ----- Message ----- From: Interface, Rad Results In Sent: 02/28/2024  11:59 PM EDT To: Reche GORMAN Finder, NP

## 2024-03-01 NOTE — Telephone Encounter (Signed)
 Left the pt a message to call the office back to endorse CT ANGIO AORTA results per Reche Finder, NP.   Went ahead and placed the 6 month repeat CHEST CT ANGIO AORTA in the system, as well as repeat BMET at that time.  Will endorse to the pt when she returns a call back or views in her mychart.  Will also make her aware at that time that scheduling will reach out to her to coordinate the repeat CT ANGIO AORTA in 6 months.   Will monitor for mychart review.

## 2024-03-15 ENCOUNTER — Encounter: Payer: Self-pay | Admitting: Family Medicine

## 2024-03-15 ENCOUNTER — Ambulatory Visit: Payer: Self-pay | Admitting: Family Medicine

## 2024-03-15 ENCOUNTER — Ambulatory Visit: Admitting: Family Medicine

## 2024-03-15 VITALS — BP 134/78 | HR 74 | Temp 97.5°F | Ht 64.0 in | Wt 208.2 lb

## 2024-03-15 DIAGNOSIS — E1165 Type 2 diabetes mellitus with hyperglycemia: Secondary | ICD-10-CM

## 2024-03-15 DIAGNOSIS — I10 Essential (primary) hypertension: Secondary | ICD-10-CM | POA: Diagnosis not present

## 2024-03-15 DIAGNOSIS — M4802 Spinal stenosis, cervical region: Secondary | ICD-10-CM

## 2024-03-15 DIAGNOSIS — F40243 Fear of flying: Secondary | ICD-10-CM | POA: Diagnosis not present

## 2024-03-15 DIAGNOSIS — E871 Hypo-osmolality and hyponatremia: Secondary | ICD-10-CM

## 2024-03-15 DIAGNOSIS — E785 Hyperlipidemia, unspecified: Secondary | ICD-10-CM | POA: Diagnosis not present

## 2024-03-15 DIAGNOSIS — E1169 Type 2 diabetes mellitus with other specified complication: Secondary | ICD-10-CM

## 2024-03-15 DIAGNOSIS — E782 Mixed hyperlipidemia: Secondary | ICD-10-CM

## 2024-03-15 DIAGNOSIS — Z7984 Long term (current) use of oral hypoglycemic drugs: Secondary | ICD-10-CM

## 2024-03-15 LAB — CBC WITH DIFFERENTIAL/PLATELET
Basophils Absolute: 0 K/uL (ref 0.0–0.1)
Basophils Relative: 0.5 % (ref 0.0–3.0)
Eosinophils Absolute: 0.1 K/uL (ref 0.0–0.7)
Eosinophils Relative: 1 % (ref 0.0–5.0)
HCT: 39.9 % (ref 36.0–46.0)
Hemoglobin: 13.8 g/dL (ref 12.0–15.0)
Lymphocytes Relative: 19.9 % (ref 12.0–46.0)
Lymphs Abs: 1.7 K/uL (ref 0.7–4.0)
MCHC: 34.6 g/dL (ref 30.0–36.0)
MCV: 87.8 fl (ref 78.0–100.0)
Monocytes Absolute: 0.5 K/uL (ref 0.1–1.0)
Monocytes Relative: 5.9 % (ref 3.0–12.0)
Neutro Abs: 6.3 K/uL (ref 1.4–7.7)
Neutrophils Relative %: 72.7 % (ref 43.0–77.0)
Platelets: 305 K/uL (ref 150.0–400.0)
RBC: 4.54 Mil/uL (ref 3.87–5.11)
RDW: 12.3 % (ref 11.5–15.5)
WBC: 8.6 K/uL (ref 4.0–10.5)

## 2024-03-15 LAB — COMPREHENSIVE METABOLIC PANEL WITH GFR
ALT: 28 U/L (ref 0–35)
AST: 22 U/L (ref 0–37)
Albumin: 4.7 g/dL (ref 3.5–5.2)
Alkaline Phosphatase: 46 U/L (ref 39–117)
BUN: 27 mg/dL — ABNORMAL HIGH (ref 6–23)
CO2: 24 meq/L (ref 19–32)
Calcium: 9.8 mg/dL (ref 8.4–10.5)
Chloride: 94 meq/L — ABNORMAL LOW (ref 96–112)
Creatinine, Ser: 0.77 mg/dL (ref 0.40–1.20)
GFR: 79.83 mL/min (ref >60.00)
Glucose, Bld: 93 mg/dL (ref 70–99)
Potassium: 4.2 meq/L (ref 3.5–5.1)
Sodium: 127 meq/L — ABNORMAL LOW (ref 135–145)
Total Bilirubin: 0.5 mg/dL (ref 0.2–1.2)
Total Protein: 7.3 g/dL (ref 6.0–8.3)

## 2024-03-15 LAB — MICROALBUMIN / CREATININE URINE RATIO
Creatinine,U: 105.4 mg/dL
Microalb Creat Ratio: 9.5 mg/g (ref 0.0–30.0)
Microalb, Ur: 1 mg/dL (ref 0.0–1.9)

## 2024-03-15 LAB — LIPID PANEL
Cholesterol: 162 mg/dL (ref 0–200)
HDL: 53.6 mg/dL (ref >39.00)
LDL Cholesterol: 78 mg/dL (ref 0–99)
NonHDL: 108.84
Total CHOL/HDL Ratio: 3
Triglycerides: 154 mg/dL — ABNORMAL HIGH (ref 0.0–149.0)
VLDL: 30.8 mg/dL (ref 0.0–40.0)

## 2024-03-15 LAB — HEMOGLOBIN A1C: Hgb A1c MFr Bld: 5.7 % (ref 4.6–6.5)

## 2024-03-15 MED ORDER — SPIRONOLACTONE 25 MG PO TABS: 25.0000 mg | ORAL_TABLET | Freq: Every day | ORAL | 1 refills | Status: AC

## 2024-03-15 MED ORDER — EZETIMIBE 10 MG PO TABS: 10.0000 mg | ORAL_TABLET | Freq: Every day | ORAL | 1 refills | Status: AC

## 2024-03-15 MED ORDER — ATORVASTATIN CALCIUM 80 MG PO TABS: 80.0000 mg | ORAL_TABLET | Freq: Every day | ORAL | 1 refills | Status: AC

## 2024-03-15 MED ORDER — DULOXETINE HCL 60 MG PO CPEP: 60.0000 mg | ORAL_CAPSULE | Freq: Every day | ORAL | 1 refills | Status: AC

## 2024-03-15 MED ORDER — ALPRAZOLAM 0.5 MG PO TABS: 0.5000 mg | ORAL_TABLET | Freq: Two times a day (BID) | ORAL | 0 refills | Status: AC | PRN

## 2024-03-15 NOTE — Patient Instructions (Signed)

## 2024-03-15 NOTE — Progress Notes (Signed)
 Subjective:     Patient ID: [name], female    DOB: 01-30-1956, 68 y.o.   MRN: [id number]  Chief Complaint  Patient presents with   Diabetes    Pt is here for 6 month Chronic follow up     Discussed the use of AI scribe software for clinical note transcription with the patient, who gave verbal consent to proceed.  History of Present Illness [name] is a 68 year old female with hypertension and diabetes who presents for follow-up on her blood pressure and diabetes management.  She is currently taking Mounjaro  5 mg for diabetes but finds it ineffective and experiences side effects such as nausea, a 'washed out feeling', anxiety, and possibly depression. She has been on Mounjaro  since May and plans to discontinue it after this month due to these side effects. She previously tried Ozempic  but did not tolerate it well. Despite efforts to manage her diet and exercise, including working with a trainer and swimming, she has not achieved her desired weight loss. She started at 233 pounds and is now at 204 pounds but struggles with maintaining consistency due to her symptoms.  She is on multiple medications for hypertension, including spironolactone  25 mg, metoprolol  25 mg, and Exforge  10/320 mg. Her blood pressure readings at home are generally in the 120s/70s range, with occasional readings around 130/70s. She recently had a CT scan and angio which showed a slight increase in her aneurysm size from 4.3 to 4.6, contributing to her feelings of frustration and depression.  She takes atorvastatin  80 mg and Zetia  10 mg for cholesterol management. No chest pain, shortness of breath, headaches, dizziness, leg swelling, vomiting, or diarrhea, but she reports persistent nausea.  She has been taking miralax  for constipation for almost a year but is transitioning to using prunes, fiber, and tea to manage her digestion. She is also on Cymbalta , which she recently increased to help with nerve  pain and mood stabilization. She has scoliosis with curves in the upper and lower spine and reports muscle pain, particularly on the left side. She manages this with meloxicam  15 mg, acupuncture, and cupping.  She uses Xanax  as needed for flying and anticipates needing it for upcoming travel around Thanksgiving. She experiences anxiety, particularly after medical tests, and finds these experiences emotionally exhausting.    Health Maintenance Due  Topic Date Due   Diabetic kidney evaluation - Urine ACR  Never done   FOOT EXAM  09/13/2023   HEMOGLOBIN A1C  01/23/2024   OPHTHALMOLOGY EXAM  02/10/2024    Past Medical History:  Diagnosis Date   Anemia    Anxiety    Arthritis    Diabetes mellitus without complication (HCC)    Headache    Hyperlipemia    Hypertension    Lung nodule    Scoliosis    Thoracic aortic aneurysm     Past Surgical History:  Procedure Laterality Date   ANKLE SURGERY Right    BREAST BIOPSY     CESAREAN SECTION     x3   CHOLECYSTECTOMY, LAPAROSCOPIC     ESOPHAGOGASTRODUODENOSCOPY     HEMORRHOID SURGERY     PARTIAL KNEE ARTHROPLASTY Right 10/09/2021   Procedure: UNICOMPARTMENTAL KNEE;  Surgeon: Josefina Chew, MD;  Location: [facility];  Service: Orthopedics;  Laterality: Right;   TOTAL KNEE ARTHROPLASTY Left 12/04/2012         Current Outpatient Medications:    amLODipine -valsartan  (EXFORGE ) 10-320 MG tablet, Take 1 tablet by mouth  daily., Disp: 90 tablet, Rfl: 3   baclofen  (LIORESAL ) 10 MG tablet, Take 10 mg by mouth 3 (three) times daily., Disp: , Rfl:    Cholecalciferol 125 MCG (5000 UT) capsule, Take by mouth., Disp: , Rfl:    clobetasol  cream (TEMOVATE ) 0.05 %, Apply 1 Application topically every 3 (three) days., Disp: 60 g, Rfl: 1   meloxicam  (MOBIC ) 15 MG tablet, Take 15 mg by mouth daily., Disp: , Rfl:    methocarbamol  (ROBAXIN ) 500 MG tablet, Take 1 tablet (500 mg total) by mouth every 6 (six) hours as needed., Disp: 90 tablet, Rfl: 1    metoprolol  succinate (TOPROL -XL) 25 MG 24 hr tablet, TAKE 1 TABLET BY MOUTH DAILY, Disp: 90 tablet, Rfl: 1   Multiple Vitamins-Minerals (MULTIVITAMIN PO), Take 1 tablet by mouth daily., Disp: , Rfl:    Probiotic Product (PROBIOTIC DAILY PO), Take 1 capsule by mouth daily., Disp: , Rfl:    Turmeric 500 MG CAPS, Take 500 mg by mouth daily., Disp: , Rfl:    valACYclovir  (VALTREX ) 1000 MG tablet, Take 1 tablet (1,000 mg total) by mouth daily as needed (cold sores)., Disp: 90 tablet, Rfl: 1   ALPRAZolam  (XANAX ) 0.5 MG tablet, Take 1 tablet (0.5 mg total) by mouth 2 (two) times daily as needed for anxiety., Disp: 10 tablet, Rfl: 0   atorvastatin  (LIPITOR) 80 MG tablet, Take 1 tablet (80 mg total) by mouth daily., Disp: 90 tablet, Rfl: 1   DULoxetine  (CYMBALTA ) 60 MG capsule, Take 1 capsule (60 mg total) by mouth daily., Disp: 90 capsule, Rfl: 1   ezetimibe  (ZETIA ) 10 MG tablet, Take 1 tablet (10 mg total) by mouth daily., Disp: 90 tablet, Rfl: 1   spironolactone  (ALDACTONE ) 25 MG tablet, Take 1 tablet (25 mg total) by mouth daily., Disp: 90 tablet, Rfl: 1  Allergies  Allergen Reactions   Ace Inhibitors Cough    Other Reaction(s): cough, Other (See Comments)  other   Latex     rash   Penicillins     Vaginal Infection    Rosuvastatin Other (See Comments)    Leg cramps  Other Reaction(s): Other (See Comments)  Other   ROS neg/noncontributory except as noted HPI/below      Objective:     BP 134/78 (BP Location: Left Arm, Patient Position: Sitting, Cuff Size: Large)   Pulse 74   Temp (!) 97.5 F (36.4 C) (Temporal)   Ht 5' 4 (1.626 m)   Wt 208 lb 4 oz (94.5 kg)   LMP 05/06/2002   SpO2 98%   BMI 35.75 kg/m  Wt Readings from Last 3 Encounters:  03/15/24 208 lb 4 oz (94.5 kg)  11/17/23 205 lb (93 kg)  09/08/23 212 lb 4 oz (96.3 kg)    Physical Exam GENERAL: Well developed well nourished no acute distress HEAD EYES EARS NOSE THROAT: Normocephalic atraumatic, conjunctiva not  injected, sclera nonicteric CARDIAC: Regular rate and rhythm, S1 S2 present , no murmur, dorsalis pedis 2 plus bilaterally NECK: Supple, no thyromegaly, no nodes, no carotid bruits LUNGS: Clear to auscultation bilaterally, no wheezes ABDOMEN: Bowel sounds present, soft, non tender non distended, no hepatosplenomegaly, no masses EXTREMITIES: No edema MUSCULOSKELETAL: No gross abnormalities NEUROLOGICAL: Alert and oriented x3, cranial nerves II through XII intact PSYCHIATRIC: Normal mood, good eye contact       Assessment & Plan:  Fear of flying  Hyperlipidemia associated with type 2 diabetes mellitus (HCC) -     Ezetimibe ; Take 1 tablet (10 mg total)  by mouth daily.  Dispense: 90 tablet; Refill: 1 -     Lipid panel  Essential hypertension -     Spironolactone ; Take 1 tablet (25 mg total) by mouth daily.  Dispense: 90 tablet; Refill: 1 -     CBC with Differential/Platelet -     Comprehensive metabolic panel with GFR  Type 2 diabetes mellitus with hyperglycemia, without long-term current use of insulin (HCC) -     Hemoglobin A1c -     Microalbumin / creatinine urine ratio  Long term current use of oral hypoglycemic drug  Mixed hyperlipidemia -     Atorvastatin  Calcium ; Take 1 tablet (80 mg total) by mouth daily.  Dispense: 90 tablet; Refill: 1  Spinal stenosis of cervical region -     DULoxetine  HCl; Take 1 capsule (60 mg total) by mouth daily.  Dispense: 90 capsule; Refill: 1  Other orders -     ALPRAZolam ; Take 1 tablet (0.5 mg total) by mouth 2 (two) times daily as needed for anxiety.  Dispense: 10 tablet; Refill: 0    Assessment and Plan Assessment & Plan Type 2 diabetes mellitus   She discontinued Mounjaro  due to persistent nausea, a washed-out feeling, anxiety, and possible depression, despite weight loss. Dietary intake will be monitored and adjusted as needed, with a potential referral to a nutritionist if adjustments are insufficient. An A1c test has been ordered.  Continue diet/exercise  Essential hypertension   Her blood pressure is well-controlled with spironolactone  25 mg, metoprolol  25 mg, and Exforge  10/320 mg, with home readings in the 120s/70s range. She will continue the current antihypertensive regimen, and the spironolactone  prescription has been refilled.  Mixed hyperlipidemia   She is on atorvastatin  80 mg and Zetia  10 mg, with no recent cholesterol check. A cholesterol test has been ordered.  Abdominal aortic aneurysm, stable, under surveillance   The aneurysm has increased from 4.3 cm to 4.6 cm, but no immediate surgical intervention is required. She experiences anxiety related to the aneurysm size increase. Annual CT scans will continue for surveillance, and Xanax  may be considered for anxiety related to CT scans.  Depression   She experiences anxiety and possible depression, potentially exacerbated by Mounjaro . Currently on Cymbalta  30 mg, recently increased to a higher dose for nerve pain and mood stabilization. She will continue Cymbalta  at the higher dose, with mood monitoring and treatment adjustments as needed.  Scoliosis and cervical spinal stenosis   Diagnosed with scoliosis in two locations, she experiences muscle pain and uses meloxicam  intermittently for pain management. Concerns about long-term use of meloxicam  due to potential kidney and liver effects are noted. Alternating meloxicam  doses (15 mg and 7.5 mg) will be considered to manage pain while minimizing side effects. Non-pharmacological interventions such as acupuncture and cupping will continue.  Fear of flying   She experiences anxiety related to flying, managed with Xanax  as needed. Xanax  has been prescribed for use during flights. Pdmp checked  General Health Maintenance   She is due for an eye exam and mammogram, while the bone density test is deferred. An eye exam and mammogram will be scheduled.     Return in about 6 months (around 09/12/2024) for chronic  follow-up.  Jenkins CHRISTELLA Carrel, MD

## 2024-03-15 NOTE — Progress Notes (Signed)
 Labs are great except sodium is too low-decrease water intake and sch lab only appt for Thursday to repeat(order in)

## 2024-03-18 ENCOUNTER — Other Ambulatory Visit

## 2024-03-18 DIAGNOSIS — E871 Hypo-osmolality and hyponatremia: Secondary | ICD-10-CM

## 2024-03-18 LAB — TSH: TSH: 0.69 u[IU]/mL (ref 0.35–5.50)

## 2024-03-18 LAB — BASIC METABOLIC PANEL WITH GFR
BUN: 19 mg/dL (ref 6–23)
CO2: 26 meq/L (ref 19–32)
Calcium: 9.5 mg/dL (ref 8.4–10.5)
Chloride: 99 meq/L (ref 96–112)
Creatinine, Ser: 0.74 mg/dL (ref 0.40–1.20)
GFR: 83.73 mL/min (ref >60.00)
Glucose, Bld: 123 mg/dL — ABNORMAL HIGH (ref 70–99)
Potassium: 4.6 meq/L (ref 3.5–5.1)
Sodium: 134 meq/L — ABNORMAL LOW (ref 135–145)

## 2024-03-19 ENCOUNTER — Ambulatory Visit: Payer: Self-pay | Admitting: Family Medicine

## 2024-03-19 ENCOUNTER — Other Ambulatory Visit: Payer: Self-pay

## 2024-03-19 DIAGNOSIS — E871 Hypo-osmolality and hyponatremia: Secondary | ICD-10-CM

## 2024-03-19 LAB — OSMOLALITY: Osmolality: 287 mosm/kg (ref 278–305)

## 2024-03-19 NOTE — Progress Notes (Signed)
 Better.  Not sure if she was drinking too much water, or a lab problem.  Repeat bmp in few weeks for stablity-dx hyponatremia

## 2024-03-19 NOTE — Progress Notes (Signed)
 Called pt left message notifying and also put order in smk

## 2024-04-06 ENCOUNTER — Ambulatory Visit: Payer: Self-pay

## 2024-04-06 NOTE — Telephone Encounter (Signed)
 FYI Only or Action Required?: Action required by provider: clinical question for provider and update on patient condition. Pt Question: Can I take Cymbalta  60 mg every other day instead of daily?  Patient was last seen in primary care on 03/15/2024 by Wendolyn Jenkins Jansky, MD.  Called Nurse Triage reporting Insomnia.  Symptoms began several weeks ago.  Interventions attempted: OTC medications: Melatonin, Benadryl .  Symptoms are: unchanged.  Triage Disposition: See PCP Within 2 Weeks  Patient/caregiver understands and will follow disposition?: No, wishes to speak with PCP  Copied from CRM #[id number]. Topic: Clinical - Medication Question >> Apr 06, 2024  9:45 AM Drema MATSU wrote: Reason for CRM: Patient stated that she has been on DULoxetine  (CYMBALTA ) 60 MG capsule for 3 weeks and can't sleep. Patient wants to know if she can take a pill every other day instead of everyday. Reason for Disposition  [1] Insomnia lasts > 2 weeks AND [2] no improvement after using Care Advice  Answer Assessment - Initial Assessment Questions Additional info:  Patient had been maintained on Cymbalta  30mg  every other day, approximately 3 weeks ago Cymbalta  was increased to 60mg  daily. She began having sleep disturbance with new dose, she has been trying melatonin and benadryl  without effect, she was hoping in time her body would adjust but does not seem to be. Patient is asking if she can take Cymbalta  60mg  every other day vs daily. She is taking Cymbalta  at night and has not tried changing to morning.   Clinical question for provider: Can I take Cymbalta  60 mg every other day instead of daily? Requests call back or MyChart message with recommendation.    1. DESCRIPTION: Tell me about your sleeping problem. (e.g., waking frequently during night, sleeping during day and awake at night, trouble falling asleep) How bad is it?      Unable to fall asleep-taking about 4 hours before she falls asleep 2. ONSET: How  long have you been having trouble sleeping? (e.g., days, weeks, months; longstanding sleep problems)     3 weeks  3. DAYTIME SLEEP PATTERN: How much time do you spend sleeping or napping during the day?      4. STRESSORS: Is there anything that is making you feel stressed? Is there something that worries you?     Denies  5. PAIN: Do you have any pain that is keeping you awake? (e.g., back pain, joint pain) If Yes, ask How bad is the pain? (e.g., scale 0-10; mild, moderate, severe).     Denies  6. CAFFEINE: Do you drink caffeinated beverages? If Yes, ask How much each day? (e.g., coffee, tea, colas)      7. ALCOHOL USE OR SUBSTANCE USE: Do you drink alcohol or use any substances?      8. MEDICINE CHANGE: Has there been any recent change in medicines? (e.g., new medicine started, stopped, or dose changed).      Cymbalta  increased 30 mg every other day now 60 mg daily, new dose for 3 weeks. She takes her at night.  9. TREATMENT: What have you done so far to treat this sleep problem? (e.g., prescription or OTC sleep medicines, herbal or dietary supplements, cannabis, relaxation strategies)     Benadryl , melatonin 10. OTHER SYMPTOMS: Do you have any other symptoms?  (e.g., difficulty breathing)       Feels a bit agitated in the evenings 11. PREGNANCY: Is there any chance you are pregnant? When was your last menstrual period?  Protocols used: Insomnia-A-AH

## 2024-04-08 ENCOUNTER — Telehealth: Payer: Self-pay

## 2024-04-08 NOTE — Telephone Encounter (Signed)
 Please advise   Copied from CRM #[id number]. Topic: Clinical - Medication Question >> Apr 06, 2024  9:45 AM Drema MATSU wrote: Reason for CRM: Patient stated that she has been on DULoxetine  (CYMBALTA ) 60 MG capsule for 3 weeks and can't sleep. Patient wants to know if she can take a pill every other day instead of everyday. >> Apr 07, 2024  4:13 PM Rea ORN wrote: Pt called to follow up on the request for Cymbalta  30 mg rx. Please call back to advise status of this request, [phone]

## 2024-04-14 ENCOUNTER — Telehealth: Payer: Self-pay

## 2024-04-14 NOTE — Telephone Encounter (Signed)
 Copied from CRM #[id number]. Topic: Clinical - Prescription Issue >> Apr 14, 2024 10:13 AM Macario HERO wrote: Reason for CRM: Patient called said the pharmacy did not receive her new prescription for Cymbalta . Per notes she was advised they sent in a 30mg  but the pharmacy has not received it and she does not want to take it every other day.

## 2024-04-20 NOTE — Progress Notes (Unsigned)
 Breast and Pelvic Exam Patient name: [name] MRN [id number]  Date of birth: 1955/07/23 Chief Complaint:   Gynecologic Exam  History of Present Illness:   [name] is a 68 y.o. G76P0013 Caucasian female being seen today for a breast and pelvic exam.  Has a new grand baby on the way, due in early January.  Doing well.  Denies vaginal bleeding.  Has lost about 25 pounds since I saw her last.  She has been using GLP1 but had so much nausea and constipation with them.  Most recent Hb1A1c and was 5.7.    Patient's last menstrual period was 05/06/2002.  Last pap: 12/24/2021. Results were: NILM w/ HRHPV not done. H/O abnormal pap: no Last mammogram: 06/20/2022. Results were: normal. Patient has not had one done this year. Family h/o breast cancer: no Last colonoscopy: 02/10/2018 Had a rectal polyp. Benign rectal polyp removed 06/2018.  Family h/o colorectal cancer: no.  She is aware this is due.       04/21/2024   10:53 AM 11/17/2023   10:14 AM 06/02/2023    1:48 PM 02/25/2023    3:08 PM 11/11/2022   10:35 AM  Depression screen PHQ 2/9  Decreased Interest 0 0 0 0 0  Down, Depressed, Hopeless 0 0 2 0 0  PHQ - 2 Score 0 0 2 0 0  Altered sleeping  0 2 3 0  Tired, decreased energy  0 1 0 2  Change in appetite  0 1 0 0  Feeling bad or failure about yourself   0 0 0 0  Trouble concentrating  0 0 0 0  Moving slowly or fidgety/restless  0 0 0 0  Suicidal thoughts  0 0 0 0  PHQ-9 Score  0  6  3  2    Difficult doing work/chores  Not difficult at all Not difficult at all Not difficult at all Not difficult at all     Data saved with a previous flowsheet row definition        06/02/2023    1:48 PM 02/25/2023    3:09 PM 11/11/2022   10:35 AM  GAD 7 : Generalized Anxiety Score  Nervous, Anxious, on Edge 1 1 2   Control/stop worrying 1 0 0  Worry too much - different things 1 0 0  Trouble relaxing 1 0 0  Restless 0 0 0  Easily annoyed or irritable 1 0 0  Afraid - awful might  happen 0 0 0  Total GAD 7 Score 5 1 2   Anxiety Difficulty Not difficult at all Not difficult at all Not difficult at all     Review of Systems:   Pertinent items are noted in HPI Denies any urinary changes.  Denies pelvic pain.  Pertinent History Reviewed:  Reviewed past medical,surgical, social and family history.  Reviewed problem list, medications and allergies. Physical Assessment:   Vitals:   04/21/24 1051  BP: 120/65  Pulse: 67  SpO2: 99%  Weight: 208 lb 12.8 oz (94.7 kg)  Height: 5' 4 (1.626 m)  Body mass index is 35.84 kg/m.        Physical Examination:   General appearance - well appearing, and in no distress  Mental status - alert, oriented to person, place, and time  Psych:  She has a normal mood and affect  Skin - warm and dry, normal color, no suspicious lesions noted  Chest - effort normal, all lung fields clear to auscultation bilaterally  Heart -  normal rate and regular rhythm  Neck:  midline trachea, no thyromegaly or nodules  Breasts - breasts appear normal, no suspicious masses, no skin or nipple changes or  axillary nodes  Abdomen - soft, nontender, nondistended, no masses or organomegaly  Pelvic - VULVA: normal appearing vulva with no masses, tenderness or lesions   VAGINA: normal appearing vagina with normal color and discharge, no lesions   CERVIX: normal appearing cervix without discharge or lesions, no CMT  Thin prep pap is updated per pt request  UTERUS: uterus is felt to be normal size, shape, consistency and nontender   ADNEXA: No adnexal masses or tenderness noted.  Rectal - normal rectal, good sphincter tone, no masses felt.  Extremities:  No swelling or varicosities noted  Chaperone present for exam  No results found for this or any previous visit (from the past 24 hours).  Assessment & Plan:  1. Encntr for gyn exam (general) (routine) w/o abn findings (Primary) - Pap smear updated today - Mammogram overdue.  She is aware. -  Colonoscopy 2019.  She is aware follow up due 5 years. Referral placed for screening today. - lab work done with PCP, Dr. Wendolyn - vaccines reviewed/updated  2. Postmenopausal  3. Cervical cancer screening - Cytology - PAP( [facility])  4. Vaginal atrophy - estradiol  (ESTRACE ) 0.01 % CREA vaginal cream; 1 gram vaginally twice weekly  Dispense: 42.5 g; Refill: 3   No orders of the defined types were placed in this encounter.   Meds: No orders of the defined types were placed in this encounter.   Follow-up: No follow-ups on file.  Sonia GORMAN Pinal, MD 04/21/2024 11:18 AM

## 2024-04-21 ENCOUNTER — Other Ambulatory Visit (INDEPENDENT_AMBULATORY_CARE_PROVIDER_SITE_OTHER)

## 2024-04-21 ENCOUNTER — Other Ambulatory Visit (HOSPITAL_COMMUNITY)
Admission: RE | Admit: 2024-04-21 | Discharge: 2024-04-21 | Disposition: A | Discharge status: Home / Self Care | Source: Ambulatory Visit | Attending: Obstetrics & Gynecology | Admitting: Obstetrics & Gynecology

## 2024-04-21 ENCOUNTER — Other Ambulatory Visit: Payer: Self-pay

## 2024-04-21 ENCOUNTER — Encounter (HOSPITAL_BASED_OUTPATIENT_CLINIC_OR_DEPARTMENT_OTHER): Payer: Self-pay | Admitting: Obstetrics & Gynecology

## 2024-04-21 ENCOUNTER — Ambulatory Visit (INDEPENDENT_AMBULATORY_CARE_PROVIDER_SITE_OTHER): Admitting: Obstetrics & Gynecology

## 2024-04-21 VITALS — BP 120/65 | HR 67 | Ht 64.0 in | Wt 208.8 lb

## 2024-04-21 DIAGNOSIS — E871 Hypo-osmolality and hyponatremia: Secondary | ICD-10-CM

## 2024-04-21 DIAGNOSIS — Z1211 Encounter for screening for malignant neoplasm of colon: Secondary | ICD-10-CM

## 2024-04-21 DIAGNOSIS — Z124 Encounter for screening for malignant neoplasm of cervix: Secondary | ICD-10-CM

## 2024-04-21 DIAGNOSIS — Z1331 Encounter for screening for depression: Secondary | ICD-10-CM | POA: Diagnosis not present

## 2024-04-21 DIAGNOSIS — N952 Postmenopausal atrophic vaginitis: Secondary | ICD-10-CM

## 2024-04-21 DIAGNOSIS — Z780 Asymptomatic menopausal state: Secondary | ICD-10-CM

## 2024-04-21 DIAGNOSIS — Z01419 Encounter for gynecological examination (general) (routine) without abnormal findings: Secondary | ICD-10-CM | POA: Diagnosis not present

## 2024-04-21 MED ORDER — ESTRADIOL 0.01 % VA CREA: TOPICAL_CREAM | VAGINAL | 3 refills | Status: AC

## 2024-04-21 NOTE — Patient Instructions (Addendum)
 Dr. Shyrl, [facility] [address], [place], [place] [address] [phone]

## 2024-04-22 ENCOUNTER — Ambulatory Visit: Payer: Self-pay | Admitting: Family Medicine

## 2024-04-22 LAB — BASIC METABOLIC PANEL WITH GFR
BUN: 23 mg/dL (ref 7–25)
CO2: 23 mmol/L (ref 20–32)
Calcium: 10.2 mg/dL (ref 8.6–10.4)
Chloride: 101 mmol/L (ref 98–110)
Creat: 0.94 mg/dL (ref 0.50–1.05)
Glucose, Bld: 96 mg/dL (ref 65–99)
Potassium: 4.8 mmol/L (ref 3.5–5.3)
Sodium: 136 mmol/L (ref 135–146)
eGFR: 67 mL/min/1.73m2 (ref >=60)

## 2024-04-22 NOTE — Progress Notes (Signed)
 Labs ok.

## 2024-04-23 ENCOUNTER — Ambulatory Visit (HOSPITAL_BASED_OUTPATIENT_CLINIC_OR_DEPARTMENT_OTHER): Payer: Self-pay | Admitting: Obstetrics & Gynecology

## 2024-04-23 LAB — CYTOLOGY - PAP: Diagnosis: NEGATIVE

## 2024-04-23 NOTE — Progress Notes (Signed)
 Called pt and notified smk

## 2024-05-15 ENCOUNTER — Other Ambulatory Visit: Payer: Self-pay | Admitting: Family Medicine

## 2024-05-17 ENCOUNTER — Encounter: Payer: Self-pay | Admitting: Family Medicine

## 2024-05-17 ENCOUNTER — Ambulatory Visit: Admitting: Family Medicine

## 2024-05-17 VITALS — BP 136/82 | HR 84 | Temp 98.1°F | Ht 64.0 in | Wt 211.5 lb

## 2024-05-17 DIAGNOSIS — Z7985 Long-term (current) use of injectable non-insulin antidiabetic drugs: Secondary | ICD-10-CM

## 2024-05-17 DIAGNOSIS — E1165 Type 2 diabetes mellitus with hyperglycemia: Secondary | ICD-10-CM | POA: Diagnosis not present

## 2024-05-17 DIAGNOSIS — I7121 Aneurysm of the ascending aorta, without rupture: Secondary | ICD-10-CM | POA: Diagnosis not present

## 2024-05-17 DIAGNOSIS — E2839 Other primary ovarian failure: Secondary | ICD-10-CM

## 2024-05-17 MED ORDER — MELOXICAM 15 MG PO TABS: 15.0000 mg | ORAL_TABLET | Freq: Every day | ORAL | 1 refills | Status: AC

## 2024-05-17 MED ORDER — TIRZEPATIDE 5 MG/0.5ML ~~LOC~~ SOAJ: 5.0000 mg | SUBCUTANEOUS | 1 refills | Status: AC

## 2024-05-17 NOTE — Patient Instructions (Addendum)
 It was very nice to see you today! Meloxicam -1/2 tab/day and whole tab if bad day or alternate 1/2 with a whole but max 1 tab/day  Let me know which CVTS you want.  Mounjaro  - can do every 2 wks.   Methocarbamol  up to 3x/day as needed  Stretches, etc.  Keep active  Low starch/sugar diet  Oral ozempic (rybelsys)    PLEASE NOTE:  If you had any lab tests please let us  know if you have not heard back within a few days. You may see your results on MyChart before we have a chance to review them but we will give you a call once they are reviewed by us . If we ordered any referrals today, please let us  know if you have not heard from their office within the next week.   Please try these tips to maintain a healthy lifestyle:  Eat most of your calories during the day when you are active. Eliminate processed foods including packaged sweets (pies, cakes, cookies), reduce intake of potatoes, white bread, white pasta, and white rice. Look for whole grain options, oat flour or almond flour.  Each meal should contain half fruits/vegetables, one quarter protein, and one quarter carbs (no bigger than a computer mouse).  Cut down on sweet beverages. This includes juice, soda, and sweet tea. Also watch fruit intake, though this is a healthier sweet option, it still contains natural sugar! Limit to 3 servings daily.  Drink at least 1 glass of water with each meal and aim for at least 8 glasses per day  Exercise at least 150 minutes every week.

## 2024-05-17 NOTE — Progress Notes (Signed)
 "  Subjective:     Patient ID: [name], female    DOB: [birth year], 69 y.o.   MRN: [id number]  Chief Complaint  Patient presents with   Medication Refill    Pt wants to go over all medications     Discussed the use of AI scribe software for clinical note transcription with the patient, who gave verbal consent to proceed.  History of Present Illness [name] is a 69 year old female with osteoarthritis and scoliosis who presents with increasing joint pain and medication management.  She experiences increasing arthritis symptoms, which have progressed from occasional to weekly occurrences, primarily affecting her hands. She also has shooting pain in both feet, which she attributes to her spinal issues. The pain is described as a 'shock wave' that comes and goes. She engages in deep water swimming and yoga, which she finds painful but continues to do gently.  She takes meloxicam  15 mg for ten days straight, then attempts to go five or six days without it, but the pain returns. She recalls a previous discussion about alternating meloxicam  doses between 15 mg and 7.5 mg, but she does not remember the details. She has multiple prescriptions from different doctors, including baclofen  and methocarbamol , which she uses for pain management. She finds methocarbamol  effective and uses it as needed, particularly before physical activities like aerobics classes.  She has scoliosis and is concerned about the impact of her medications on her liver and kidneys. She is cautious about mixing medications with alcohol. She has not had a bone density test and is considering it to assess for osteoporosis, as she has osteoarthritis. She expresses concern about radiation exposure due to her regular CT scans for her aortic valve.  She has an aortic aneurysm that is being monitored by cardiology. Her blood pressure is well-controlled, and she monitors it regularly.  DM-She has been managing her weight and  is concerned about regaining weight. She previously used Mounjaro  for weight management and is considering resuming it, possibly at a lower frequency to manage side effects. She reports experiencing hunger pains again and is concerned about maintaining her weight loss.  She is planning a [place] cruise in April and is considering how her medication regimen might affect her ability to enjoy the trip.    Health Maintenance Due  Topic Date Due   FOOT EXAM  09/13/2023   OPHTHALMOLOGY EXAM  02/10/2024   Mammogram  06/20/2024    Past Medical History:  Diagnosis Date   Anemia    Anxiety    Arthritis    Diabetes mellitus without complication (HCC)    Headache    Hyperlipemia    Hypertension    Lung nodule    Scoliosis    Thoracic aortic aneurysm     Past Surgical History:  Procedure Laterality Date   ANKLE SURGERY Right    BREAST BIOPSY     CESAREAN SECTION     x3   CHOLECYSTECTOMY, LAPAROSCOPIC     ESOPHAGOGASTRODUODENOSCOPY     HEMORRHOID SURGERY     PARTIAL KNEE ARTHROPLASTY Right 10/09/2021   Procedure: UNICOMPARTMENTAL KNEE;  Surgeon: Josefina Chew, MD;  Location: [facility];  Service: Orthopedics;  Laterality: Right;   TOTAL KNEE ARTHROPLASTY Left 12/04/2012        Current Medications[1]  Allergies[2] ROS neg/noncontributory except as noted HPI/below      Objective:     BP 136/82 (BP Location: Left Arm, Patient Position: Sitting, Cuff Size: Normal)  Pulse 84   Temp 98.1 F (36.7 C) (Temporal)   Ht 5' 4 (1.626 m)   Wt 211 lb 8 oz (95.9 kg)   LMP 05/06/2002   SpO2 98%   BMI 36.30 kg/m  Wt Readings from Last 3 Encounters:  05/17/24 211 lb 8 oz (95.9 kg)  04/21/24 208 lb 12.8 oz (94.7 kg)  03/15/24 208 lb 4 oz (94.5 kg)    Physical Exam GENERAL: Well developed well nourished no acute distress HEAD EYES EARS NOSE THROAT: Normocephalic atraumatic, conjunctiva not injected, sclera nonicteric EXTREMITIES: No edema MUSCULOSKELETAL: No gross  abnormalities NEUROLOGICAL: Alert and oriented x3, cranial nerves II through XII intact PSYCHIATRIC: Normal mood, good eye contact       Assessment & Plan:  Estrogen deficiency -     DG Bone Density; Future  Aneurysm of ascending aorta without rupture    Assessment and Plan Assessment & Plan Aneurysm of ascending aorta without rupture   The aneurysm of the ascending aorta is being monitored by cardiology, currently measuring 4.8 cm, up from 4.3 cm over four years. Surgical intervention is not indicated as it has not reached the 5 cm threshold. Blood pressure is well-controlled, which is crucial for managing the aneurysm. She is considering a second opinion from a cardiothoracic surgeon, with options including Dr. Shyrl in [place] or a teaching hospital like [facility] or [facility]. Continue monitoring aneurysm size with cardiology and maintain blood pressure control. Consider referral to a cardiothoracic surgeon for a second opinion if desired.  Type 2 diabetes mellitus   Her diabetes is well-controlled with current management. She is considering weight management options, including Mounjaro  injections, to aid in weight loss and maintain glycemic control. Although Rybelsus  was discussed as an oral alternative, she prefers injections. Potential side effects of GLP-1 agonists, such as nausea and constipation, were discussed. She is motivated to continue weight loss efforts to improve overall health and manage diabetes. Continue Mounjaro  injections, with the option to extend the dosing interval to every two weeks if tolerated. Monitor blood glucose levels regularly and encourage continued lifestyle modifications, including diet and exercise.  Osteoarthritis   She experiences increasing frequency of symptoms, particularly in the hands and feet. Pain is managed with meloxicam , but she reports needing more frequent dosing. An alternating dosing strategy was discussed to manage symptoms while  minimizing potential side effects. She also uses methocarbamol  for nerve pain relief, which she finds effective. Continue meloxicam  with an alternating dosing strategy: half tablet daily or whole tablet on bad days. Continue methocarbamol  as needed for nerve pain and encourage continuation of physical activities such as yoga and swimming.  General Health Maintenance   She has not had a bone density scan previously. The importance of screening for osteoporosis was discussed, especially given her age and history of osteoarthritis. She expressed concerns about radiation exposure but was reassured about the low radiation levels of the scan. A bone density scan was ordered to assess for osteoporosis.     Return for as sch in May.  Jenkins CHRISTELLA Carrel, MD     [1]  Current Outpatient Medications:    ALPRAZolam  (XANAX ) 0.5 MG tablet, Take 1 tablet (0.5 mg total) by mouth 2 (two) times daily as needed for anxiety., Disp: 10 tablet, Rfl: 0   amLODipine -valsartan  (EXFORGE ) 10-320 MG tablet, Take 1 tablet by mouth daily., Disp: 90 tablet, Rfl: 3   atorvastatin  (LIPITOR) 80 MG tablet, Take 1 tablet (80 mg total) by mouth daily., Disp: 90 tablet,  Rfl: 1   baclofen  (LIORESAL ) 10 MG tablet, Take 10 mg by mouth 3 (three) times daily., Disp: , Rfl:    Cholecalciferol 125 MCG (5000 UT) capsule, Take by mouth., Disp: , Rfl:    DULoxetine  (CYMBALTA ) 60 MG capsule, Take 1 capsule (60 mg total) by mouth daily., Disp: 90 capsule, Rfl: 1   estradiol  (ESTRACE ) 0.01 % CREA vaginal cream, 1 gram vaginally twice weekly, Disp: 42.5 g, Rfl: 3   ezetimibe  (ZETIA ) 10 MG tablet, Take 1 tablet (10 mg total) by mouth daily., Disp: 90 tablet, Rfl: 1   meloxicam  (MOBIC ) 15 MG tablet, Take 15 mg by mouth daily., Disp: , Rfl:    methocarbamol  (ROBAXIN ) 500 MG tablet, Take 1 tablet (500 mg total) by mouth every 6 (six) hours as needed., Disp: 90 tablet, Rfl: 1   metoprolol  succinate (TOPROL -XL) 25 MG 24 hr tablet, TAKE 1 TABLET BY MOUTH  DAILY, Disp: 90 tablet, Rfl: 1   Multiple Vitamins-Minerals (MULTIVITAMIN PO), Take 1 tablet by mouth daily., Disp: , Rfl:    Probiotic Product (PROBIOTIC DAILY PO), Take 1 capsule by mouth daily., Disp: , Rfl:    spironolactone  (ALDACTONE ) 25 MG tablet, Take 1 tablet (25 mg total) by mouth daily., Disp: 90 tablet, Rfl: 1   Turmeric 500 MG CAPS, Take 500 mg by mouth daily., Disp: , Rfl:    valACYclovir  (VALTREX ) 1000 MG tablet, Take 1 tablet (1,000 mg total) by mouth daily as needed (cold sores)., Disp: 90 tablet, Rfl: 1 [2]  Allergies Allergen Reactions   Ace Inhibitors Cough    Other Reaction(s): cough, Other (See Comments)  other   Latex     rash   Penicillins     Vaginal Infection    Rosuvastatin Other (See Comments)    Leg cramps  Other Reaction(s): Other (See Comments)  Other   "

## 2024-05-26 ENCOUNTER — Encounter (HOSPITAL_BASED_OUTPATIENT_CLINIC_OR_DEPARTMENT_OTHER): Payer: Self-pay

## 2024-09-13 ENCOUNTER — Ambulatory Visit: Admitting: Family Medicine

## 2024-11-22 ENCOUNTER — Ambulatory Visit

## 2025-01-17 ENCOUNTER — Other Ambulatory Visit (HOSPITAL_BASED_OUTPATIENT_CLINIC_OR_DEPARTMENT_OTHER)
